# Patient Record
Sex: Female | Born: 1990 | Race: Black or African American | Hispanic: No | Marital: Single | Smoking: Current every day smoker
Health system: Southern US, Community
[De-identification: ages and names within clinical notes are randomized; demographics above are authoritative.]

## PROBLEM LIST (undated history)

## (undated) HISTORY — PX: APPENDECTOMY: SHX54

---
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
## 2011-01-18 LAB — DIFFERENTIAL
Basophils Absolute: 0 10*3/uL (ref 0.0–0.1)
Lymphocytes Relative: 11 % — ABNORMAL LOW (ref 12–46)
Lymphs Abs: 1.1 10*3/uL (ref 0.7–4.0)
Neutro Abs: 8.8 10*3/uL — ABNORMAL HIGH (ref 1.7–7.7)
Neutrophils Relative %: 82 % — ABNORMAL HIGH (ref 43–77)

## 2011-01-18 LAB — CBC
Platelets: 285 10*3/uL (ref 150–400)
RBC: 4.03 MIL/uL (ref 3.87–5.11)
RDW: 16.4 % — ABNORMAL HIGH (ref 11.5–15.5)
WBC: 10.6 10*3/uL — ABNORMAL HIGH (ref 4.0–10.5)

## 2011-01-18 LAB — URINALYSIS, ROUTINE W REFLEX MICROSCOPIC
Glucose, UA: NEGATIVE mg/dL
Hgb urine dipstick: NEGATIVE
Protein, ur: NEGATIVE mg/dL
Specific Gravity, Urine: 1.018 (ref 1.005–1.030)
pH: 7.5 (ref 5.0–8.0)

## 2011-01-18 LAB — URINE MICROSCOPIC-ADD ON

## 2011-01-18 LAB — PROTIME-INR
INR: 0.94 (ref 0.00–1.49)
Prothrombin Time: 12.8 seconds (ref 11.6–15.2)

## 2011-01-18 LAB — POCT I-STAT, CHEM 8
BUN: 9 mg/dL (ref 6–23)
Creatinine, Ser: 0.7 mg/dL (ref 0.4–1.2)
Hemoglobin: 12.2 g/dL (ref 12.0–15.0)
Potassium: 3.7 mEq/L (ref 3.5–5.1)
Sodium: 140 mEq/L (ref 135–145)

## 2011-01-28 ENCOUNTER — Inpatient Hospital Stay (HOSPITAL_COMMUNITY)
Admission: AD | Admit: 2011-01-28 | Discharge: 2011-01-28 | Disposition: A | Discharge status: Home / Self Care | Payer: PPO | Source: Ambulatory Visit | Attending: Obstetrics & Gynecology | Admitting: Obstetrics & Gynecology

## 2011-01-28 DIAGNOSIS — T148XXA Other injury of unspecified body region, initial encounter: Secondary | ICD-10-CM

## 2011-01-28 LAB — URINALYSIS, ROUTINE W REFLEX MICROSCOPIC
Bilirubin Urine: NEGATIVE
Hgb urine dipstick: NEGATIVE
Nitrite: NEGATIVE
Protein, ur: NEGATIVE mg/dL
Specific Gravity, Urine: 1.02 (ref 1.005–1.030)
Urobilinogen, UA: 1 mg/dL (ref 0.0–1.0)

## 2011-01-28 LAB — POCT PREGNANCY, URINE: Preg Test, Ur: NEGATIVE

## 2011-01-28 LAB — URINE MICROSCOPIC-ADD ON

## 2011-07-02 ENCOUNTER — Emergency Department (HOSPITAL_COMMUNITY)
Admission: EM | Admit: 2011-07-02 | Discharge: 2011-07-02 | Disposition: A | Discharge status: Home / Self Care | Payer: Self-pay | Attending: Emergency Medicine | Admitting: Emergency Medicine

## 2011-07-02 DIAGNOSIS — R112 Nausea with vomiting, unspecified: Secondary | ICD-10-CM | POA: Insufficient documentation

## 2011-07-02 DIAGNOSIS — K2970 Gastritis, unspecified, without bleeding: Secondary | ICD-10-CM | POA: Insufficient documentation

## 2011-07-02 LAB — POCT I-STAT, CHEM 8
Calcium, Ion: 1.2 mmol/L (ref 1.12–1.32)
Chloride: 104 mEq/L (ref 96–112)
Creatinine, Ser: 0.6 mg/dL (ref 0.50–1.10)
Glucose, Bld: 89 mg/dL (ref 70–99)
HCT: 40 % (ref 36.0–46.0)
Potassium: 3.8 mEq/L (ref 3.5–5.1)

## 2011-07-02 LAB — URINALYSIS, ROUTINE W REFLEX MICROSCOPIC
Glucose, UA: NEGATIVE mg/dL
Hgb urine dipstick: NEGATIVE
Specific Gravity, Urine: 1.029 (ref 1.005–1.030)
pH: 5.5 (ref 5.0–8.0)

## 2011-10-21 ENCOUNTER — Emergency Department (HOSPITAL_COMMUNITY)
Admission: EM | Admit: 2011-10-21 | Discharge: 2011-10-21 | Disposition: A | Discharge status: Home / Self Care | Payer: Self-pay | Attending: Emergency Medicine | Admitting: Emergency Medicine

## 2011-10-21 ENCOUNTER — Encounter: Payer: Self-pay | Admitting: Emergency Medicine

## 2011-10-21 DIAGNOSIS — R109 Unspecified abdominal pain: Secondary | ICD-10-CM | POA: Insufficient documentation

## 2011-10-21 DIAGNOSIS — R112 Nausea with vomiting, unspecified: Secondary | ICD-10-CM | POA: Insufficient documentation

## 2011-10-21 DIAGNOSIS — N898 Other specified noninflammatory disorders of vagina: Secondary | ICD-10-CM | POA: Insufficient documentation

## 2011-10-21 LAB — URINALYSIS, ROUTINE W REFLEX MICROSCOPIC
Bilirubin Urine: NEGATIVE
Hgb urine dipstick: NEGATIVE
Nitrite: NEGATIVE
Protein, ur: NEGATIVE mg/dL
Urobilinogen, UA: 1 mg/dL (ref 0.0–1.0)

## 2011-10-21 LAB — CBC
HCT: 35.5 % — ABNORMAL LOW (ref 36.0–46.0)
Hemoglobin: 11.7 g/dL — ABNORMAL LOW (ref 12.0–15.0)
MCH: 28 pg (ref 26.0–34.0)
MCV: 84.9 fL (ref 78.0–100.0)
Platelets: 236 10*3/uL (ref 150–400)
RBC: 4.18 MIL/uL (ref 3.87–5.11)
WBC: 4 10*3/uL (ref 4.0–10.5)

## 2011-10-21 LAB — COMPREHENSIVE METABOLIC PANEL
ALT: 6 U/L (ref 0–35)
Alkaline Phosphatase: 59 U/L (ref 39–117)
BUN: 9 mg/dL (ref 6–23)
CO2: 27 mEq/L (ref 19–32)
Calcium: 9.7 mg/dL (ref 8.4–10.5)
GFR calc Af Amer: >90 mL/min (ref >90)
GFR calc non Af Amer: >90 mL/min (ref >90)
Glucose, Bld: 82 mg/dL (ref 70–99)
Sodium: 138 mEq/L (ref 135–145)

## 2011-10-21 LAB — DIFFERENTIAL
Eosinophils Absolute: 0.1 10*3/uL (ref 0.0–0.7)
Eosinophils Relative: 3 % (ref 0–5)
Lymphocytes Relative: 43 % (ref 12–46)
Lymphs Abs: 1.7 10*3/uL (ref 0.7–4.0)
Monocytes Absolute: 0.3 10*3/uL (ref 0.1–1.0)
Monocytes Relative: 8 % (ref 3–12)

## 2011-10-21 LAB — LIPASE, BLOOD: Lipase: 36 U/L (ref 11–59)

## 2011-10-21 MED ORDER — HYDROCODONE-ACETAMINOPHEN 5-325 MG PO TABS: ORAL_TABLET | ORAL | Status: AC

## 2011-10-21 MED ORDER — ONDANSETRON 8 MG PO TBDP: 8.0000 mg | ORAL_TABLET | Freq: Three times a day (TID) | ORAL | Status: AC | PRN

## 2011-10-21 NOTE — ED Provider Notes (Signed)
Medical screening examination/treatment/procedure(s) were performed by non-physician practitioner and as supervising physician I was immediately available for consultation/collaboration.   Koltan Portocarrero, MD 10/21/11 1108 

## 2011-10-21 NOTE — ED Provider Notes (Signed)
History     CSN: [id number] Arrival date & time: 10/21/2011  7:14 AM   First MD Initiated Contact with Patient 10/21/11 [phone]      Chief Complaint  Patient presents with  . Abdominal Pain    (Consider location/radiation/quality/duration/timing/severity/associated sxs/prior treatment) HPI Comments: Patient with onset of bilateral midabdominal pain described as a tightness, starting at about 6 PM yesterday afternoon. Pain was bad enough the patient called in from work. Approximately midnight she started having vomiting and noted some streaks of blood in the vomit. She went to sleep and woke up this morning with the pain still present. She came to the emergency department for evaluation. Patient denies fever, chest pain, shortness of breath, constipation or diarrhea. Patient denies blood in her urine or dysuria. Patient is currently on her menstrual period. She has implanon for birth control and states she's been having periods every 2 weeks that have been heavy.the patient has had an appendectomy one year ago and states that she had pain like this when she had an appendicitis.  Patient is a 20 y.o. female presenting with abdominal pain. The history is provided by the patient.  Abdominal Pain The primary symptoms of the illness include abdominal pain, nausea, vomiting and vaginal bleeding. The primary symptoms of the illness do not include fever, shortness of breath, diarrhea or dysuria. The current episode started 13 to 24 hours ago. The onset of the illness was gradual. The problem has been gradually worsening.  The patient has not had a change in bowel habit. Symptoms associated with the illness do not include chills, constipation, hematuria, frequency or back pain.    History reviewed. No pertinent past medical history.  History reviewed. No pertinent past surgical history.  No family history on file.  History  Substance Use Topics  . Smoking status: Never Smoker   . Smokeless  tobacco: Not on file  . Alcohol Use: No    OB History    Grav Para Term Preterm Abortions TAB SAB Ect Mult Living                  Review of Systems  Constitutional: Negative for fever and chills.  HENT: Negative for sore throat and rhinorrhea.   Eyes: Negative for discharge.  Respiratory: Negative for shortness of breath.   Cardiovascular: Negative for chest pain.  Gastrointestinal: Positive for nausea, vomiting and abdominal pain. Negative for diarrhea and constipation.  Genitourinary: Positive for vaginal bleeding. Negative for dysuria, frequency and hematuria.  Musculoskeletal: Negative for myalgias and back pain.  Skin: Negative for rash.  Neurological: Negative for headaches.  Psychiatric/Behavioral: Negative for confusion.    Allergies  Review of patient's allergies indicates no known allergies.  Home Medications  No current outpatient prescriptions on file.  BP 136/89  Pulse 73  Temp(Src) 98.2 F (36.8 C) (Oral)  Resp 20  SpO2 100%  LMP 10/20/2011  Physical Exam  Nursing note and vitals reviewed. Constitutional: She is oriented to person, place, and time. She appears well-developed and well-nourished.  HENT:  Head: Normocephalic and atraumatic.  Eyes: Right eye exhibits no discharge. Left eye exhibits no discharge.  Neck: Normal range of motion. Neck supple.  Cardiovascular: Normal rate and regular rhythm.  Exam reveals no gallop and no friction rub.   No murmur heard. Pulmonary/Chest: Effort normal and breath sounds normal. No respiratory distress. She has no wheezes.  Abdominal: Soft. Bowel sounds are normal. She exhibits no distension and no mass. There is tenderness. There is  no rebound and no guarding.       Mild tenderness exhibited with deep palpation of bilateral mid abdomen. No rebound or guarding. Bowel sounds normal.  Musculoskeletal: Normal range of motion.  Neurological: She is alert and oriented to person, place, and time.  Skin: Skin is warm  and dry. No rash noted.  Psychiatric: She has a normal mood and affect.    ED Course  Procedures (including critical care time)  Labs Reviewed  CBC - Abnormal; Notable for the following:    Hemoglobin 11.7 (*)    HCT 35.5 (*)    All other components within normal limits  COMPREHENSIVE METABOLIC PANEL - Abnormal; Notable for the following:    Total Bilirubin 0.2 (*)    All other components within normal limits  URINALYSIS, ROUTINE W REFLEX MICROSCOPIC - Abnormal; Notable for the following:    Leukocytes, UA SMALL (*)    All other components within normal limits  URINE MICROSCOPIC-ADD ON - Abnormal; Notable for the following:    Squamous Epithelial / LPF MANY (*)    All other components within normal limits  DIFFERENTIAL  LIPASE, BLOOD  POCT PREGNANCY, URINE  POCT PREGNANCY, URINE   No results found.   1. Abdominal pain     7:44 AM patient seen and examined. Workup initiated.  10:27 AM Patient was discussed with Gwyneth Sprout, MD  10:27 AM patient informed of lab results. Exam is unchanged. She is asking for something to eat. Patient was given ginger ale and crackers in the room and she ate those without any difficulty. Patient given strict return instructions including having worsening pain, fever, persistent vomiting. Patient is up walking around in emergency department. Patient urged to return in 24 hours for recheck if no improvement in symptoms.  MDM  Vitals are stable, no fever.  No signs of dehydration, tolerating PO's.  Lungs are clear.  No focal abdominal pain, no concern for appendicitis, cholecystitis, pancreatitis, ruptured viscus, UTI, kidney stone, or any other abdominal etiology.  Supportive therapy indicated with return if symptoms worsen.  Patient counseled.         Eustace Moore [place], [place] 10/21/11 [phone]

## 2011-10-21 NOTE — ED Notes (Signed)
PT. REPORTS UPPER/MID ABDOMINAL PAIN WITH VOMITTING ONSET LAST NIGHT , NO DIARRHEA , DENIES FEVER OR CHILLS.

## 2012-06-15 ENCOUNTER — Emergency Department (HOSPITAL_COMMUNITY)
Admission: EM | Admit: 2012-06-15 | Discharge: 2012-06-15 | Disposition: A | Discharge status: Home / Self Care | Payer: Self-pay | Attending: Emergency Medicine | Admitting: Emergency Medicine

## 2012-06-15 ENCOUNTER — Encounter (HOSPITAL_COMMUNITY): Payer: Self-pay | Admitting: *Deleted

## 2012-06-15 DIAGNOSIS — S39012A Strain of muscle, fascia and tendon of lower back, initial encounter: Secondary | ICD-10-CM

## 2012-06-15 DIAGNOSIS — M5380 Other specified dorsopathies, site unspecified: Secondary | ICD-10-CM | POA: Insufficient documentation

## 2012-06-15 DIAGNOSIS — M546 Pain in thoracic spine: Secondary | ICD-10-CM | POA: Insufficient documentation

## 2012-06-15 LAB — URINALYSIS, ROUTINE W REFLEX MICROSCOPIC
Glucose, UA: NEGATIVE mg/dL
Leukocytes, UA: NEGATIVE
Nitrite: NEGATIVE
pH: 6 (ref 5.0–8.0)

## 2012-06-15 LAB — URINE MICROSCOPIC-ADD ON

## 2012-06-15 LAB — PREGNANCY, URINE: Preg Test, Ur: NEGATIVE

## 2012-06-15 MED ORDER — IBUPROFEN 800 MG PO TABS: 800.0000 mg | ORAL_TABLET | Freq: Three times a day (TID) | ORAL | Status: AC | PRN

## 2012-06-15 MED ORDER — DIAZEPAM 5 MG PO TABS: 5.0000 mg | ORAL_TABLET | Freq: Three times a day (TID) | ORAL | Status: AC | PRN

## 2012-06-15 MED ORDER — HYDROCODONE-ACETAMINOPHEN 5-325 MG PO TABS
1.0000 | ORAL_TABLET | Freq: Once | ORAL | Status: AC
  Administered 2012-06-15: 1 via ORAL
  Filled 2012-06-15: qty 1

## 2012-06-15 MED ORDER — IBUPROFEN 800 MG PO TABS
800.0000 mg | ORAL_TABLET | Freq: Once | ORAL | Status: AC
  Administered 2012-06-15: 800 mg via ORAL
  Filled 2012-06-15: qty 1

## 2012-06-15 NOTE — ED Notes (Signed)
Lower back pain for one week.  No known injury.  lmp now

## 2012-06-15 NOTE — ED Notes (Signed)
The patient is AOx4 and comfortable with her discharge instructions.  The patient's ride home is present. 

## 2012-06-15 NOTE — ED Provider Notes (Signed)
History     CSN: [id number]  Arrival date & time 06/15/12  0053   First MD Initiated Contact with Patient 06/15/12 0059      Chief Complaint  Patient presents with  . Back Pain    (Consider location/radiation/quality/duration/timing/severity/associated sxs/prior treatment) HPI Comments: Patient with no significant past medical history presents to the emergency department with chief complaint of back pain.  Onset of symptoms began approximately a week ago when patient was moving.  She states the pain is located in her mid thoracic region but denies any trauma.  She has not tried any home therapies including over-the-counter medications or heat/cold therapy.  She denies numbness or tingling of extremities, saddle paresthesias, inability to ambulate, loss control bar bladder, IV drug use, cancer history, dysuria, urinary frequency, hematuria, vaginal dc, N/V, fever, night sweats or chills.pt states she is currently on her menstrual period and denies a hx of back pain   Patient is a 21 y.o. female presenting with back pain. The history is provided by the patient.  Back Pain     History reviewed. No pertinent past medical history.  History reviewed. No pertinent past surgical history.  No family history on file.  History  Substance Use Topics  . Smoking status: Never Smoker   . Smokeless tobacco: Not on file  . Alcohol Use: No    OB History    Grav Para Term Preterm Abortions TAB SAB Ect Mult Living                  Review of Systems  Musculoskeletal: Positive for back pain.  All other systems reviewed and are negative.    Allergies  Review of patient's allergies indicates no known allergies.  Home Medications   Current Outpatient Rx  Name Route Sig Dispense Refill  . ETONOGESTREL 68 MG IXL IMPL Subcutaneous Inject 1 each into the skin once. Due to be removed this year      BP 110/70  Pulse 80  Temp 98.3 F (36.8 C) (Oral)  Resp 20  LMP 06/15/2012  Physical  Exam  Nursing note and vitals reviewed. Constitutional: She is oriented to person, place, and time. She appears well-developed and well-nourished. No distress.  HENT:  Head: Normocephalic and atraumatic.  Eyes: Conjunctivae and EOM are normal. Pupils are equal, round, and reactive to light. No scleral icterus.  Neck: Normal range of motion and full passive range of motion without pain. Neck supple. No spinous process tenderness and no muscular tenderness present. No rigidity. Normal range of motion present. No Brudzinski's sign noted.  Cardiovascular: Normal rate, regular rhythm and intact distal pulses.  Exam reveals no gallop and no friction rub.   No murmur heard. Pulmonary/Chest: Effort normal and breath sounds normal. No respiratory distress. She has no wheezes. She has no rales. She exhibits no tenderness.  Musculoskeletal:       Cervical back: She exhibits normal range of motion, no tenderness, no bony tenderness and no pain.       Thoracic back: She exhibits tenderness, bony tenderness, pain and spasm.       Lumbar back: She exhibits no tenderness, no bony tenderness, no pain, no spasm and normal pulse.       Back:       Right foot: She exhibits no swelling.       Left foot: She exhibits no swelling.       Bilateral lower extremities nontender without color change, baseline range of motion of extremities with  intact distal pulses, capillary refill less than 2 seconds bilaterally.  Pt has increased pain w ROM of lumbar spine. Pain w ambulation, no sign of ataxia.  Neurological: She is alert and oriented to person, place, and time. She has normal strength and normal reflexes. No sensory deficit. Gait (no ataxia, slowed and hunched d/t pain ) abnormal.       Sensation at baseline for light touch in all 4 distal extremities, motor symmetric & bilateral 5/5 (hips: abduction, adduction, flexion; knee: flexion & extension; foot: dorsiflexion, plantar flexion, toes: dorsi flexion) Patellar &  ankle reflexes intact.   Skin: Skin is warm and dry. No rash noted. She is not diaphoretic. No erythema. No pallor.  Psychiatric: She has a normal mood and affect.    ED Course  Procedures (including critical care time)  Labs Reviewed  URINALYSIS, ROUTINE W REFLEX MICROSCOPIC - Abnormal; Notable for the following:    APPearance CLOUDY (*)     Hgb urine dipstick LARGE (*)     Protein, ur 30 (*)     All other components within normal limits  URINE MICROSCOPIC-ADD ON - Abnormal; Notable for the following:    Squamous Epithelial / LPF FEW (*)     All other components within normal limits  PREGNANCY, URINE   No results found.   No diagnosis found.    MDM  Back pain, muscle spasm   Patient with back pain.  No neurological deficits and normal neuro exam.  Patient can walk but states is painful.  No loss of bowel or bladder control.  No concern for cauda equina.  No fever, night sweats, weight loss, h/o cancer, IVDU.  RICE protocol and pain medicine indicated and discussed with patient.        Jaci Carrel, [place] 06/15/12 [phone]

## 2012-06-15 NOTE — ED Provider Notes (Signed)
Medical screening examination/treatment/procedure(s) were performed by non-physician practitioner and as supervising physician I was immediately available for consultation/collaboration.  Theador Jezewski, MD 06/15/12 0704 

## 2013-02-19 ENCOUNTER — Emergency Department (HOSPITAL_COMMUNITY)
Admission: EM | Admit: 2013-02-19 | Discharge: 2013-02-19 | Disposition: A | Discharge status: Home / Self Care | Payer: Self-pay | Attending: Emergency Medicine | Admitting: Emergency Medicine

## 2013-02-19 ENCOUNTER — Encounter (HOSPITAL_COMMUNITY): Payer: Self-pay | Admitting: Emergency Medicine

## 2013-02-19 ENCOUNTER — Emergency Department (HOSPITAL_COMMUNITY): Payer: Self-pay

## 2013-02-19 DIAGNOSIS — M25469 Effusion, unspecified knee: Secondary | ICD-10-CM | POA: Insufficient documentation

## 2013-02-19 DIAGNOSIS — F17200 Nicotine dependence, unspecified, uncomplicated: Secondary | ICD-10-CM | POA: Insufficient documentation

## 2013-02-19 DIAGNOSIS — M25461 Effusion, right knee: Secondary | ICD-10-CM

## 2013-02-19 MED ORDER — NAPROXEN 500 MG PO TABS: 500.0000 mg | ORAL_TABLET | Freq: Two times a day (BID) | ORAL | Status: DC

## 2013-02-19 MED ORDER — HYDROCODONE-ACETAMINOPHEN 5-325 MG PO TABS: 1.0000 | ORAL_TABLET | Freq: Four times a day (QID) | ORAL | Status: DC | PRN

## 2013-02-19 NOTE — ED Notes (Signed)
Ortho paged. 

## 2013-02-19 NOTE — Progress Notes (Signed)
Orthopedic Tech Progress Note Patient Details:  Elaine Gonzalez 1990-11-29 [id number]  Ortho Devices Type of Ortho Device: Crutches;Knee Immobilizer Ortho Device/Splint Location: RLE Ortho Device/Splint Interventions: Ordered;Application   Jennye Moccasin 02/19/2013, 6:57 PM

## 2013-02-19 NOTE — ED Provider Notes (Signed)
History    This chart was scribed for non-physician practitioner working with Carleene Cooper III, MD by Sofie Rower, ED Scribe. This patient was seen in room TR05C/TR05C and the patient's care was started at 6:06PM.   CSN: [id number]  Arrival date & time 02/19/13  1624   First MD Initiated Contact with Patient 02/19/13 1806      Chief Complaint  Patient presents with  . Knee Pain    (Consider location/radiation/quality/duration/timing/severity/associated sxs/prior treatment) Patient is a 22 y.o. female presenting with knee pain. The history is provided by the patient. No language interpreter was used.  Knee Pain Location:  Knee Time since incident:  1 day Injury: no   Knee location:  R knee Pain details:    Quality:  Aching   Radiates to:  Does not radiate   Severity:  Moderate   Onset quality:  Sudden   Duration:  1 day   Timing:  Constant   Progression:  Worsening Chronicity:  New Dislocation: no   Foreign body present:  No foreign bodies Tetanus status:  Unknown Prior injury to area:  No Relieved by:  Nothing Worsened by:  Bearing weight and activity Ineffective treatments:  None tried   Elaine Gonzalez is a 22 y.o. female , with no known medical hx, who presents to the Emergency Department complaining of sudden, progressively worsening, non radiating, knee pain, located at the right knee, onset yesterday (02/18/13).  Associated symptoms include swelling located at the right knee. The pt reports she was chasing after her puppy dog yesterday, where she believes she may have injured her right knee. The pt informs she is unable to ambulate upon her right lower extremity at the present point and time.  The pt is a current everyday smoker, in addition to drinking alcohol.     History reviewed. No pertinent past medical history.  History reviewed. No pertinent past surgical history.  History reviewed. No pertinent family history.  History  Substance Use Topics  . Smoking  status: Current Every Day Smoker  . Smokeless tobacco: Not on file  . Alcohol Use: Yes    OB History   Grav Para Term Preterm Abortions TAB SAB Ect Mult Living                  Review of Systems  Musculoskeletal: Positive for joint swelling and arthralgias.  All other systems reviewed and are negative.    Allergies  Review of patient's allergies indicates no known allergies.  Home Medications   Current Outpatient Rx  Name  Route  Sig  Dispense  Refill  . etonogestrel (IMPLANON) 68 MG IMPL implant   Subcutaneous   Inject 1 each into the skin once. Due to be removed this year           BP 123/83  Pulse 100  Temp(Src) 97.9 F (36.6 C) (Oral)  Resp 18  SpO2 100%  LMP 02/19/2013  Physical Exam  Nursing note and vitals reviewed. Constitutional: She is oriented to person, place, and time. She appears well-developed and well-nourished. No distress.  HENT:  Head: Normocephalic and atraumatic.  Eyes: EOM are normal.  Neck: Neck supple. No tracheal deviation present.  Cardiovascular: Normal rate, regular rhythm and normal heart sounds.  Exam reveals no gallop and no friction rub.   No murmur heard. Pulmonary/Chest: Effort normal and breath sounds normal. No respiratory distress. She has no wheezes.  Abdominal: Bowel sounds are normal. There is no tenderness.  Musculoskeletal: Normal range of  motion.       Right knee: She exhibits swelling. Tenderness found.  Swelling and tenderness. No instability noted on exam. Distal pulses, sensation, and movement present.   Neurological: She is alert and oriented to person, place, and time.  Skin: Skin is warm and dry.  Psychiatric: She has a normal mood and affect. Her behavior is normal.    ED Course  Procedures (including critical care time)  DIAGNOSTIC STUDIES: Oxygen Saturation is 100% on room air, normal by my interpretation.    COORDINATION OF CARE:  6:32 PM- Treatment plan concerning follow up with orthopedist  discussed with patient. Pt agrees with treatment.      Labs Reviewed - No data to display Dg Knee Complete 4 Views Right  02/19/2013  *RADIOLOGY REPORT*  Clinical Data: Fall.  Anterior knee pain.  RIGHT KNEE - COMPLETE 4+ VIEW  Comparison: None.  Findings: The patient has a large knee joint effusion.  No fracture is identified.  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  IMPRESSION: Large knee joint effusion without underlying fracture.   Original Report Authenticated By: Holley Dexter, M.D.      No diagnosis found.  Knee effusion.  Immobilizer, crutches, anti-inflammatory.  Ortho follow-up.  MDM    I personally performed the services described in this documentation, which was scribed in my presence. The recorded information has been reviewed and is accurate.        Jimmye Norman, NP 02/20/13 0000

## 2013-02-19 NOTE — ED Notes (Signed)
Pt c/o right knee pain after running yesterday; pt sts pain and swelling

## 2013-02-20 NOTE — ED Provider Notes (Signed)
Medical screening examination/treatment/procedure(s) were performed by non-physician practitioner and as supervising physician I was immediately available for consultation/collaboration.   Carleene Cooper III, MD 02/20/13 [phone]

## 2013-10-01 ENCOUNTER — Emergency Department (HOSPITAL_COMMUNITY)
Admission: EM | Admit: 2013-10-01 | Discharge: 2013-10-01 | Disposition: A | Discharge status: Home / Self Care | Payer: Medicaid - Out of State | Attending: Emergency Medicine | Admitting: Emergency Medicine

## 2013-10-01 ENCOUNTER — Encounter (HOSPITAL_COMMUNITY): Payer: Self-pay | Admitting: Emergency Medicine

## 2013-10-01 DIAGNOSIS — K529 Noninfective gastroenteritis and colitis, unspecified: Secondary | ICD-10-CM

## 2013-10-01 DIAGNOSIS — Z3202 Encounter for pregnancy test, result negative: Secondary | ICD-10-CM | POA: Insufficient documentation

## 2013-10-01 DIAGNOSIS — F17200 Nicotine dependence, unspecified, uncomplicated: Secondary | ICD-10-CM | POA: Insufficient documentation

## 2013-10-01 DIAGNOSIS — R109 Unspecified abdominal pain: Secondary | ICD-10-CM

## 2013-10-01 DIAGNOSIS — K5289 Other specified noninfective gastroenteritis and colitis: Secondary | ICD-10-CM | POA: Insufficient documentation

## 2013-10-01 DIAGNOSIS — R509 Fever, unspecified: Secondary | ICD-10-CM | POA: Insufficient documentation

## 2013-10-01 LAB — URINALYSIS, ROUTINE W REFLEX MICROSCOPIC
Bilirubin Urine: NEGATIVE
Nitrite: NEGATIVE
Specific Gravity, Urine: 1.022 (ref 1.005–1.030)
Urobilinogen, UA: 0.2 mg/dL (ref 0.0–1.0)
pH: 6 (ref 5.0–8.0)

## 2013-10-01 LAB — COMPREHENSIVE METABOLIC PANEL
Albumin: 4.6 g/dL (ref 3.5–5.2)
Alkaline Phosphatase: 50 U/L (ref 39–117)
BUN: 9 mg/dL (ref 6–23)
Calcium: 9.5 mg/dL (ref 8.4–10.5)
GFR calc Af Amer: >90 mL/min (ref >90)
Glucose, Bld: 102 mg/dL — ABNORMAL HIGH (ref 70–99)
Potassium: 4 mEq/L (ref 3.5–5.1)
Sodium: 140 mEq/L (ref 135–145)
Total Protein: 8 g/dL (ref 6.0–8.3)

## 2013-10-01 LAB — URINE MICROSCOPIC-ADD ON

## 2013-10-01 LAB — POCT PREGNANCY, URINE: Preg Test, Ur: NEGATIVE

## 2013-10-01 LAB — CBC
HCT: 36.3 % (ref 36.0–46.0)
Hemoglobin: 12.4 g/dL (ref 12.0–15.0)
MCH: 29.1 pg (ref 26.0–34.0)
MCHC: 34.2 g/dL (ref 30.0–36.0)
RDW: 14.7 % (ref 11.5–15.5)

## 2013-10-01 LAB — LIPASE, BLOOD: Lipase: 34 U/L (ref 11–59)

## 2013-10-01 MED ORDER — KETOROLAC TROMETHAMINE 30 MG/ML IJ SOLN
30.0000 mg | Freq: Once | INTRAMUSCULAR | Status: AC
  Administered 2013-10-01: 30 mg via INTRAVENOUS
  Filled 2013-10-01: qty 1

## 2013-10-01 MED ORDER — SODIUM CHLORIDE 0.9 % IV BOLUS (SEPSIS)
1000.0000 mL | Freq: Once | INTRAVENOUS | Status: AC
  Administered 2013-10-01: 1000 mL via INTRAVENOUS

## 2013-10-01 MED ORDER — HYDROMORPHONE HCL PF 1 MG/ML IJ SOLN
1.0000 mg | Freq: Once | INTRAMUSCULAR | Status: AC
  Administered 2013-10-01: 1 mg via INTRAVENOUS
  Filled 2013-10-01: qty 1

## 2013-10-01 MED ORDER — ONDANSETRON HCL 4 MG/2ML IJ SOLN
4.0000 mg | Freq: Once | INTRAMUSCULAR | Status: AC
  Administered 2013-10-01: 4 mg via INTRAVENOUS
  Filled 2013-10-01: qty 2

## 2013-10-01 NOTE — ED Notes (Signed)
Pt came to the ED stating that she has been having lower abdominal pain this morning. Pt stated that she has been having nausea, vomiting, and diarrhea. She stated that she is currently on her menstrual cycle. She has never had abdominal pain this bad. No cardiac or respiratory distress. Will continue to monitor.

## 2013-10-01 NOTE — ED Notes (Addendum)
Lower abd pain  Started this am some nausea is on her period now she states  Denies dysuria or d/c

## 2013-10-01 NOTE — ED Provider Notes (Signed)
CSN: [id number]     Arrival date & time 10/01/13  1014 History   First MD Initiated Contact with Patient 10/01/13 1016     Chief Complaint  Patient presents with  . Abdominal Pain   (Consider location/radiation/quality/duration/timing/severity/associated sxs/prior Treatment) HPI Comments: Pt is a 22 y/o healthy female who presents to the ED complaining of sudden onset abdominal pain beginning when she woke up a few hours ago. Pain described as sharp, radiating across her lower abdomen, constant rated 10/10. Standing upright worsens the pain, somewhat alleviated by bending forward. Admits to associated nausea, non-blood, non-bilious vomiting, non-bloody diarrhea, subjective fever and chills. No sick contacts, recent travel. Had onion rings for dinner last night. Began her menses yesterday, normal for her. States she gets back pain with her menses, has no back pain out of her normal.denies increased urinary frequency, urgency, dysuria, vaginal or pelvic pain. Hx of appendectomy 2011.  The history is provided by the patient.    History reviewed. No pertinent past medical history. History reviewed. No pertinent past surgical history. No family history on file. History  Substance Use Topics  . Smoking status: Current Every Day Smoker  . Smokeless tobacco: Not on file  . Alcohol Use: Yes   OB History   Grav Para Term Preterm Abortions TAB SAB Ect Mult Living                 Review of Systems  Constitutional: Positive for fever and chills.  Gastrointestinal: Positive for nausea, vomiting, abdominal pain and diarrhea.  All other systems reviewed and are negative.    Allergies  Review of patient's allergies indicates no known allergies.  Home Medications   Current Outpatient Rx  Name  Route  Sig  Dispense  Refill  . Acetaminophen-Caff-Pyrilamine (MIDOL COMPLETE PO)   Oral   Take 2 tablets by mouth daily as needed (mentral pain).         Marland Kitchen etonogestrel (IMPLANON) 68 MG IMPL  implant   Subcutaneous   Inject 1 each into the skin once. Due to be removed this year          BP 129/75  Pulse 66  Temp(Src) 98.2 F (36.8 C) (Oral)  Resp 20  SpO2 98% Physical Exam  Nursing note and vitals reviewed. Constitutional: She is oriented to person, place, and time. She appears well-developed and well-nourished. No distress.  Appears uncomfortable.  HENT:  Head: Normocephalic and atraumatic.  Mouth/Throat: Oropharynx is clear and moist.  Eyes: Conjunctivae are normal. No scleral icterus.  Neck: Normal range of motion. Neck supple.  Cardiovascular: Normal rate, regular rhythm and normal heart sounds.   Pulmonary/Chest: Effort normal and breath sounds normal.  Abdominal: Soft. Normal appearance and bowel sounds are normal. There is tenderness. There is guarding. There is no rigidity and no rebound.  Tenderness across lower abdomen. No peritoneal signs.  Musculoskeletal: Normal range of motion. She exhibits no edema.  Neurological: She is alert and oriented to person, place, and time.  Skin: Skin is warm and dry. She is not diaphoretic.  Psychiatric: She has a normal mood and affect. Her behavior is normal.    ED Course  Procedures (including critical care time) Labs Review Labs Reviewed  COMPREHENSIVE METABOLIC PANEL - Abnormal; Notable for the following:    Glucose, Bld 102 (*)    Total Bilirubin 0.2 (*)    All other components within normal limits  URINALYSIS, ROUTINE W REFLEX MICROSCOPIC - Abnormal; Notable for the following:  APPearance CLOUDY (*)    Hgb urine dipstick LARGE (*)    Leukocytes, UA MODERATE (*)    All other components within normal limits  URINE MICROSCOPIC-ADD ON - Abnormal; Notable for the following:    Squamous Epithelial / LPF MANY (*)    All other components within normal limits  CBC  LIPASE, BLOOD  POCT PREGNANCY, URINE   Imaging Review No results found.  EKG Interpretation   None       MDM   1. Abdominal pain   2.  Gastroenteritis     Pt with lower abdominal pain, n/v/d. She is in NAD but appears uncomfortable. Normal VS. No peritoneal signs.  Currently on menses. Labs pending- cbc, cmp, lipase, ua, preg. Fluids, pain control. 12:15 PM Labs normal. Pain improved, repeat abdominal exam pt is mildly tender in lower abdomen. Tolerating PO, states eating crackers helped w the pain. Stable for discharge. Return precautions given. Patient states understanding of treatment care plan and is agreeable.   Trevor Mace, PA-C 10/01/13 1216

## 2013-10-01 NOTE — ED Notes (Signed)
Pt made aware that she cannot drive on medication given at the hospital. Pt and significant other verbalized they understood.

## 2013-10-01 NOTE — ED Notes (Signed)
Provided pt with Ginger Ale to drink per her request.  Also, gave her crackers to eat. Educated patient to take small sips of ginger ale. If she doesn't become sick she can then try the crackers. Will continue to monitor.

## 2013-10-01 NOTE — ED Notes (Signed)
Pt drank all ginger ale and ate 2 packs of crackers.  No nausea or vomiting. PA is aware.

## 2013-10-06 NOTE — ED Provider Notes (Signed)
Medical screening examination/treatment/procedure(s) were performed by non-physician practitioner and as supervising physician I was immediately available for consultation/collaboration.  EKG Interpretation   None         Roney Marion, MD 10/06/13 1538

## 2013-11-21 ENCOUNTER — Encounter (HOSPITAL_COMMUNITY): Payer: Self-pay | Admitting: Emergency Medicine

## 2013-11-21 ENCOUNTER — Emergency Department (HOSPITAL_COMMUNITY): Payer: Self-pay

## 2013-11-21 ENCOUNTER — Emergency Department (HOSPITAL_COMMUNITY)
Admission: EM | Admit: 2013-11-21 | Discharge: 2013-11-21 | Disposition: A | Discharge status: Home / Self Care | Payer: Self-pay | Attending: Emergency Medicine | Admitting: Emergency Medicine

## 2013-11-21 DIAGNOSIS — F17200 Nicotine dependence, unspecified, uncomplicated: Secondary | ICD-10-CM | POA: Insufficient documentation

## 2013-11-21 DIAGNOSIS — R109 Unspecified abdominal pain: Secondary | ICD-10-CM | POA: Insufficient documentation

## 2013-11-21 DIAGNOSIS — Z3202 Encounter for pregnancy test, result negative: Secondary | ICD-10-CM | POA: Insufficient documentation

## 2013-11-21 DIAGNOSIS — R6883 Chills (without fever): Secondary | ICD-10-CM | POA: Insufficient documentation

## 2013-11-21 DIAGNOSIS — R112 Nausea with vomiting, unspecified: Secondary | ICD-10-CM | POA: Insufficient documentation

## 2013-11-21 DIAGNOSIS — Z9089 Acquired absence of other organs: Secondary | ICD-10-CM | POA: Insufficient documentation

## 2013-11-21 DIAGNOSIS — R110 Nausea: Secondary | ICD-10-CM

## 2013-11-21 LAB — URINE MICROSCOPIC-ADD ON

## 2013-11-21 LAB — WET PREP, GENITAL
Clue Cells Wet Prep HPF POC: NONE SEEN
Trich, Wet Prep: NONE SEEN
WBC, Wet Prep HPF POC: NONE SEEN
Yeast Wet Prep HPF POC: NONE SEEN

## 2013-11-21 LAB — URINALYSIS, ROUTINE W REFLEX MICROSCOPIC
Bilirubin Urine: NEGATIVE
Glucose, UA: NEGATIVE mg/dL
Ketones, ur: NEGATIVE mg/dL
NITRITE: NEGATIVE
PH: 6 (ref 5.0–8.0)
Protein, ur: NEGATIVE mg/dL
SPECIFIC GRAVITY, URINE: 1.023 (ref 1.005–1.030)
Urobilinogen, UA: 0.2 mg/dL (ref 0.0–1.0)

## 2013-11-21 LAB — COMPREHENSIVE METABOLIC PANEL
ALK PHOS: 49 U/L (ref 39–117)
ALT: 7 U/L (ref 0–35)
AST: 16 U/L (ref 0–37)
Albumin: 4.6 g/dL (ref 3.5–5.2)
BILIRUBIN TOTAL: 0.3 mg/dL (ref 0.3–1.2)
BUN: 8 mg/dL (ref 6–23)
CO2: 25 meq/L (ref 19–32)
CREATININE: 0.69 mg/dL (ref 0.50–1.10)
Calcium: 9.2 mg/dL (ref 8.4–10.5)
Chloride: 102 mEq/L (ref 96–112)
GFR calc Af Amer: >90 mL/min (ref >90)
Glucose, Bld: 96 mg/dL (ref 70–99)
Potassium: 4.1 mEq/L (ref 3.7–5.3)
SODIUM: 140 meq/L (ref 137–147)
Total Protein: 7.8 g/dL (ref 6.0–8.3)

## 2013-11-21 LAB — CBC WITH DIFFERENTIAL/PLATELET
Basophils Absolute: 0 10*3/uL (ref 0.0–0.1)
Basophils Relative: 1 % (ref 0–1)
Eosinophils Absolute: 0.1 10*3/uL (ref 0.0–0.7)
Eosinophils Relative: 3 % (ref 0–5)
HEMATOCRIT: 34.7 % — AB (ref 36.0–46.0)
Hemoglobin: 11.6 g/dL — ABNORMAL LOW (ref 12.0–15.0)
Lymphocytes Relative: 42 % (ref 12–46)
Lymphs Abs: 1.6 10*3/uL (ref 0.7–4.0)
MCH: 28.4 pg (ref 26.0–34.0)
MCHC: 33.4 g/dL (ref 30.0–36.0)
MCV: 84.8 fL (ref 78.0–100.0)
MONO ABS: 0.3 10*3/uL (ref 0.1–1.0)
Monocytes Relative: 7 % (ref 3–12)
Neutro Abs: 1.9 10*3/uL (ref 1.7–7.7)
Neutrophils Relative %: 48 % (ref 43–77)
Platelets: 228 10*3/uL (ref 150–400)
RBC: 4.09 MIL/uL (ref 3.87–5.11)
RDW: 14.3 % (ref 11.5–15.5)
WBC: 3.9 10*3/uL — ABNORMAL LOW (ref 4.0–10.5)

## 2013-11-21 LAB — LIPASE, BLOOD: Lipase: 37 U/L (ref 11–59)

## 2013-11-21 LAB — PREGNANCY, URINE: Preg Test, Ur: NEGATIVE

## 2013-11-21 MED ORDER — SODIUM CHLORIDE 0.9 % IV SOLN
INTRAVENOUS | Status: DC
Start: 2013-11-21 — End: 2013-11-21
  Administered 2013-11-21: 10:00:00 via INTRAVENOUS

## 2013-11-21 MED ORDER — ONDANSETRON HCL 4 MG PO TABS: 4.0000 mg | ORAL_TABLET | Freq: Four times a day (QID) | ORAL | Status: DC

## 2013-11-21 MED ORDER — ONDANSETRON HCL 4 MG/2ML IJ SOLN
4.0000 mg | Freq: Once | INTRAMUSCULAR | Status: AC
  Administered 2013-11-21: 4 mg via INTRAVENOUS
  Filled 2013-11-21: qty 2

## 2013-11-21 NOTE — Discharge Instructions (Signed)
Please call and set-up an appointment with  Gastroenterology, stomach doctors, to be reassessed regarding stomach pain Please rest and stay hydrated Please refrain from eating foods high in fat, grease, and spicy Please take medications as needed for nausea control Please avoid any physical or strenuous activity Please continue to monitor symptoms closely and if symptoms are to worsen or change (fever greater 101, chills, sweating, neck pain, neck stiffness, worsening or change to stomach pain, chest pain, shortness of breath, difficulty breathing) please report back to the ED immediately   Abdominal Pain, Women Abdominal (stomach, pelvic, or belly) pain can be caused by many things. It is important to tell your doctor:  The location of the pain.  Does it come and go or is it present all the time?  Are there things that start the pain (eating certain foods, exercise)?  Are there other symptoms associated with the pain (fever, nausea, vomiting, diarrhea)? All of this is helpful to know when trying to find the cause of the pain. CAUSES   Stomach: virus or bacteria infection, or ulcer.  Intestine: appendicitis (inflamed appendix), regional ileitis (Crohn's disease), ulcerative colitis (inflamed colon), irritable bowel syndrome, diverticulitis (inflamed diverticulum of the colon), or cancer of the stomach or intestine.  Gallbladder disease or stones in the gallbladder.  Kidney disease, kidney stones, or infection.  Pancreas infection or cancer.  Fibromyalgia (pain disorder).  Diseases of the female organs:  Uterus: fibroid (non-cancerous) tumors or infection.  Fallopian tubes: infection or tubal pregnancy.  Ovary: cysts or tumors.  Pelvic adhesions (scar tissue).  Endometriosis (uterus lining tissue growing in the pelvis and on the pelvic organs).  Pelvic congestion syndrome (female organs filling up with blood just before the menstrual period).  Pain with the  menstrual period.  Pain with ovulation (producing an egg).  Pain with an IUD (intrauterine device, birth control) in the uterus.  Cancer of the female organs.  Functional pain (pain not caused by a disease, may improve without treatment).  Psychological pain.  Depression. DIAGNOSIS  Your doctor will decide the seriousness of your pain by doing an examination.  Blood tests.  X-rays.  Ultrasound.  CT scan (computed tomography, special type of X-ray).  MRI (magnetic resonance imaging).  Cultures, for infection.  Barium enema (dye inserted in the large intestine, to better view it with X-rays).  Colonoscopy (looking in intestine with a lighted tube).  Laparoscopy (minor surgery, looking in abdomen with a lighted tube).  Major abdominal exploratory surgery (looking in abdomen with a large incision). TREATMENT  The treatment will depend on the cause of the pain.   Many cases can be observed and treated at home.  Over-the-counter medicines recommended by your caregiver.  Prescription medicine.  Antibiotics, for infection.  Birth control pills, for painful periods or for ovulation pain.  Hormone treatment, for endometriosis.  Nerve blocking injections.  Physical therapy.  Antidepressants.  Counseling with a psychologist or psychiatrist.  Minor or major surgery. HOME CARE INSTRUCTIONS   Do not take laxatives, unless directed by your caregiver.  Take over-the-counter pain medicine only if ordered by your caregiver. Do not take aspirin because it can cause an upset stomach or bleeding.  Try a clear liquid diet (broth or water) as ordered by your caregiver. Slowly move to a bland diet, as tolerated, if the pain is related to the stomach or intestine.  Have a thermometer and take your temperature several times a day, and record it.  Bed rest and sleep, if it  helps the pain.  Avoid sexual intercourse, if it causes pain.  Avoid stressful situations.  Keep  your follow-up appointments and tests, as your caregiver orders.  If the pain does not go away with medicine or surgery, you may try:  Acupuncture.  Relaxation exercises (yoga, meditation).  Group therapy.  Counseling. SEEK MEDICAL CARE IF:   You notice certain foods cause stomach pain.  Your home care treatment is not helping your pain.  You need stronger pain medicine.  You want your IUD removed.  You feel faint or lightheaded.  You develop nausea and vomiting.  You develop a rash.  You are having side effects or an allergy to your medicine. SEEK IMMEDIATE MEDICAL CARE IF:   Your pain does not go away or gets worse.  You have a fever.  Your pain is felt only in portions of the abdomen. The right side could possibly be appendicitis. The left lower portion of the abdomen could be colitis or diverticulitis.  You are passing blood in your stools (bright red or black tarry stools, with or without vomiting).  You have blood in your urine.  You develop chills, with or without a fever.  You pass out. MAKE SURE YOU:   Understand these instructions.  Will watch your condition.  Will get help right away if you are not doing well or get worse. Document Released: 08/19/2007 Document Revised: 01/14/2012 Document Reviewed: 09/08/2009 [facility] Patient Information 2014 [place], [place].   Emergency Department Resource Guide 1) Find a Doctor and Pay Out of Pocket Although you won't have to find out who is covered by your insurance plan, it is a good idea to ask around and get recommendations. You will then need to call the office and see if the doctor you have chosen will accept you as a new patient and what types of options they offer for patients who are self-pay. Some doctors offer discounts or will set up payment plans for their patients who do not have insurance, but you will need to ask so you aren't surprised when you get to your appointment.  2) Contact Your Local  Health Department Not all health departments have doctors that can see patients for sick visits, but many do, so it is worth a call to see if yours does. If you don't know where your local health department is, you can check in your phone book. The CDC also has a tool to help you locate your state's health department, and many state websites also have listings of all of their local health departments.  3) Find a Walk-in Clinic If your illness is not likely to be very severe or complicated, you may want to try a walk in clinic. These are popping up all over the country in pharmacies, drugstores, and shopping centers. They're usually staffed by nurse practitioners or physician assistants that have been trained to treat common illnesses and complaints. They're usually fairly quick and inexpensive. However, if you have serious medical issues or chronic medical problems, these are probably not your best option.  No Primary Care Doctor: - Call Health Connect at  [phone] - they can help you locate a primary care doctor that  accepts your insurance, provides certain services, etc. - Physician Referral Service- [phone]  Chronic Pain Problems: Organization         Address  Phone   Notes  [facility]  [phone] Patients need to be referred by their primary care doctor.   Medication  Assistance: Organization         Address  Phone   Notes  [facility] Medication Assistance Program [address] [place] [place], [place] [address] [phone] --Must be a resident of [facility] -- Must have NO insurance coverage whatsoever (no Medicaid/ Medicare, etc.) -- The pt. MUST have a primary care doctor that directs their care regularly and follows them in the community   [facility]  [phone]   [employer]  [phone]    Agencies that provide inexpensive medical care: Organization         Address  Phone   Notes  [facility]  [phone]    [facility]    [phone]   [facility] [address] [place], [place] [address] [phone]   [facility] [address] [place] [place] [phone]   Planned Parenthood    [phone]   [facility]    [phone]   [facility]  [address], [place] Phone:  [phone], Fax:  [phone] Hours of Operation:  9 am - 6 pm, M-F.  Also accepts Medicaid/Medicare and self-pay.  [facility]  [address], [place] Phone: [phone], Fax: [phone]. Hours of Operation:  8:30 am - 5:30 pm, M-F.  Also accepts Medicaid and self-pay.  [facility] [place] [address] [place] Phone: [phone]   [facility] [address] [place]asha Bence [place], [place] [phone], Ext. [phone] Mondays & Thursdays: 7-9 AM.  First 15 patients are seen on a first come, first serve basis.    Medicaid-accepting [facility] Providers:  Organization         Address  Phone   Notes  [facility] [address],  [phone] Also accepts self-pay patients.  [facility] [address] [place] [address] Laurell Josephs [place], [place]  [phone]   [facility] [address], [place] [phone]   [facility] [address], [place] [phone]   Renaye Rakers [address], [place]   [phone] Only accepts [place] Access [place] patients after they have their name applied to their card.   Self-Pay (no insurance) in [facility]:  Organization         Address  Phone   Notes  Sickle Cell Patients, [facility] Internal Medicine [address] [place], [place] [phone]   [facility] [address] [place], [place] [phone]   [facility] [place]  [address],  [place] [phone]   [facility]/Dr. Osei-Bonsu  [address] [place] or [address], [place] [phone] Phone number for both [place] and [place] locations is the same.  [facility] [address] [place] [phone]   [facility] [address], [place] or [address] [phone]   [facility] [address], [place] [phone], phone; [phone], fax Sees patients 1st and 3rd Saturday of every month.  Must not qualify for public or private insurance (i.e. Medicaid, Medicare, [place] Health Choice, Veterans' Benefits)  Household income should be no more than 200% of the poverty level The clinic  cannot treat you if you are pregnant or think you are pregnant  Sexually transmitted diseases are not treated at the clinic.    Dental Care: Organization         Address  Phone  Notes  [facility] [address], [place] [phone] Accepts children up to age 22 who are enrolled in [place] or [place] Health Choice; pregnant women with a Medicaid card; and children who have applied for Medicaid or [place] Health Choice, but were declined, whose parents can pay a reduced fee at time of service.  [facility] [place]  [address], [place] [phone] Accepts children up to age 22 who are enrolled in [place] or [place] Health Choice; pregnant women with a Medicaid card; and children who have applied for Medicaid or [place] Health Choice, but were declined, whose parents can pay a reduced fee at time of service.  [facility]  [address], [place] [phone] Patients are seen by appointment only. Walk-ins are not accepted. [facility] will see patients 22 years of age and older. Monday - Tuesday (8am-5pm) Most Wednesdays  (8:30-5pm) $30 per visit, cash only  [facility]  [address], [facility] [phone]) [phone] Patients are seen by appointment only. Walk-ins are not accepted. [facility] will see patients 22 years of age and older. One Wednesday Evening (Monthly: Volunteer Based).  $30 per visit, cash only  [employer] [facility] [facility]  [phone] for adults; Children under age 794, call Graduate Pediatric Dentistry at [phone]. Children aged 344-14, please call [phone] to request a pediatric application.  Dental services are provided in all areas of dental care including fillings, crowns and bridges, complete and partial dentures, implants, gum treatment, root canals, and extractions. Preventive care is also provided. Treatment is provided to both adults and children. Patients are selected via a lottery and there is often a waiting list.   [facility] [address], [place]  [phone] [email or web address]   [facility] [address], [place], [place] [phone], Ext. [phone] Second and Fourth Thursday of each month, opens at 6:30 AM; Clinic ends at 9 AM.  Patients are seen on a first-come first-served basis, and a limited number are seen during each clinic.   [facility]  [address], [place], [place] [phone]) [phone]   Eligibility Requirements You must have lived in [place], [place], or [place] counties for at least the last three months.   You cannot be eligible for state or federal sponsored [place]healthcare insurance, including CIGNAVeterans Administration, [place]icaid, or [employer]icare.   You generally cannot be eligible for healthcare insurance through your employer.    How to apply: Eligibility screenings are held every Tuesday and Wednesday afternoon from 1:00 pm until 4:00 pm. You do not need an appointment for the interview!  [facility] [address], [place], [place] [phone]   [facility]  [phone]   [facility]  [phone]   [facility]  [phone]    Behavioral Health Resources in the Community: Intensive Outpatient Programs Organization         Address  Phone  Notes  [facility] [address], [place], [place] [phone]   [facility] [address], [place], [place]  [phone]   [facility] [address] [place], [place]  [phone]   [facility] [address]  [place], [place] [phone] or [phone]   Substance Abuse Resources Organization         Address  Phone  Notes  [facility] Drug [facility]  [phone]   [facility]  [phone]   [facility] [place]  [phone]   Floydene Flock  [phone]   Residential & Outpatient Substance Abuse Program  [phone]   Psychological Services Organization         Address  Phone  Notes  [facility] Behavioral Health  [phone]   [facility] Services  [phone]   [facility] Mental Health [address] [place] [phone] or [phone]    Mobile Crisis Teams Organization         Address  Phone  Notes  [facility], Mobile Crisis Care Unit  [phone]   [facility]  [address] [place], [place] [phone]   Doristine Locks [address] [place] [phone]    Self-Help/Support Groups Organization         Address  Phone             Notes  [facility] [place] - variety of support groups  336- I7437963 Call for more information  Narcotics Anonymous (NA), [facility] [address], [employer] [place]  2 meetings at this location   Statistician         Address  Phone  Notes  [facility] [address],    [place]  [phone]   [facility]  [address], [place] [address], [place], [place]  [phone]   [facility] Treatment [facility] [address] [place], [place] [phone] Admissions: 8am-3pm M-F  [facility] [address],    [place], [place] [phone]   [facility] [address] [place], [place], [place] [phone]   [facility] [address],  [place], [place] [phone]   [facility] - Intensive Outpatient [address] Laurell Josephs [address], [place], [place] [phone]   [facility] ([facility] [address] [place]  [place], [place] [phone] or [phone]   [facility] ([facility]) [address] [place], [place] [phone] Accepts Medicaid  [facility] [address],  [place] [phone] Substance Abuse/Addiction Treatment   [facility] Organization         Address  Phone  Notes  [facility]  [phone]   Angie Fava, PhD [address] Ervin Knack [place], [place]   [phone] or [phone]   [facility]   [address] [place], [place] [phone]   [facility] [address], [place], [place] [phone] Insurance/Medicaid/sponsorship through [facility] and Families [address]                                    [place], [place] [phone] Therapy/tele-psych/case  [facility] [address], [place] [phone]    Dr. Lolly Mustache  [phone]   [facility] [place]  [employer] [facility] Dept. 1) [address] [place] 2) [address], [place] 3)  [address] [place] [address], [place] [phone]  (  [phone]   °[place] Child Abuse Hotline (336) 342-1394 or (336) 342-3537 (After Hours)    ° ° ° °

## 2013-11-21 NOTE — ED Notes (Signed)
PT comfortable with d/c and f/u instructions. Prescriptions x1 

## 2013-11-21 NOTE — ED Notes (Signed)
Patient presents with a chief complaint of bilateral lower quadrant abdominal pain with bilateral flank pain that began this AM. Patient reports one episode of vomiting this morning. Patient reports last bowel movement was "last week" and states this is normal for her.

## 2013-11-21 NOTE — ED Notes (Signed)
Dr. J at bedside 

## 2013-11-21 NOTE — ED Provider Notes (Signed)
CSN: [id number]     Arrival date & time 11/21/13  0932 History   First MD Initiated Contact with Patient 11/21/13 0935     Chief Complaint  Patient presents with  . Abdominal Pain   (Consider location/radiation/quality/duration/timing/severity/associated sxs/prior Treatment) The history is provided by the patient. No language interpreter was used.  Elaine Gonzalez is a 23 year old female with no significant past medical history-currently on birth control, Implanon-presenting to emergency department with abdominal pain that started approximately 2 days ago. Patient reports that the pain has gotten progressively worse. Stated that the pain is localized to bilateral lower quadrants described as a constant sharp shooting pain that radiates to her lower back. Patient reports she has been having nausea with one episode of emesis this morning mainly consisting of stomach contents - NB/NB. Reported chills and hot flashes. Reported that she's used nothing for pain. Denied fevers, chest pain, shortness of breath, difficulty breathing, diarrhea, melena, hematochezia, dysuria, vaginal pain, vaginal discharge. Patient currently menstruating.  PCP none  History reviewed. No pertinent past medical history. Past Surgical History  Procedure Laterality Date  . Appendectomy     No family history on file. History  Substance Use Topics  . Smoking status: Current Every Day Smoker  . Smokeless tobacco: Not on file  . Alcohol Use: Yes   OB History   Grav Para Term Preterm Abortions TAB SAB Ect Mult Living                 Review of Systems  Constitutional: Positive for chills. Negative for fever.  Respiratory: Negative for chest tightness and shortness of breath.   Cardiovascular: Negative for chest pain.  Gastrointestinal: Positive for nausea, vomiting and abdominal pain. Negative for diarrhea, constipation, blood in stool and anal bleeding.  Musculoskeletal: Positive for back pain.  Neurological:  Negative for dizziness and weakness.  All other systems reviewed and are negative.    Allergies  Review of patient's allergies indicates no known allergies.  Home Medications   Current Outpatient Rx  Name  Route  Sig  Dispense  Refill  . etonogestrel (IMPLANON) 68 MG IMPL implant   Subcutaneous   Inject 1 each into the skin once. Due to be removed this year         . ondansetron (ZOFRAN) 4 MG tablet   Oral   Take 1 tablet (4 mg total) by mouth every 6 (six) hours.   12 tablet   0    BP 119/68  Pulse 60  Temp(Src) 98 F (36.7 C) (Oral)  Resp 16  SpO2 100%  LMP 11/16/2013 Physical Exam  Nursing note and vitals reviewed. Constitutional: She is oriented to person, place, and time. She appears well-developed and well-nourished. No distress.  Patient found walking towards her bed hunched over, crying because she is in a lot of pain  HENT:  Head: Normocephalic and atraumatic.  Mouth/Throat: Oropharynx is clear and moist. No oropharyngeal exudate.  Eyes: Conjunctivae and EOM are normal. Pupils are equal, round, and reactive to light. Right eye exhibits no discharge. Left eye exhibits no discharge.  Neck: Normal range of motion. Neck supple.  Cardiovascular: Normal rate and regular rhythm.  Exam reveals no friction rub.   No murmur heard. Pulmonary/Chest: Effort normal. No respiratory distress. She has no wheezes. She has no rales.  Abdominal: Soft. Normal appearance and bowel sounds are normal. She exhibits no distension. There is tenderness. There is guarding. There is no rebound.    Discomfort upon palpation to  bilateral lower quadrants and suprapubic tenderness noted  Genitourinary: Vagina normal. No vaginal discharge found.  Negative swelling, erythema, inflammation, lesions, sores noted to the external genitalia. Negative swelling, erythema, inflammation, lesions noted to the vaginal canal. Cervix identified with negative erythema, swelling, strawberry appearance.  Patient currently menstruating. Bright red blood in vaginal vault. Negative CMT. Bilateral adnexal tenderness-most discomfort upon palpation to left adnexal region. Exam chaperoned with RN  Musculoskeletal: Normal range of motion. She exhibits no tenderness.  Lymphadenopathy:    She has no cervical adenopathy.  Neurological: She is alert and oriented to person, place, and time. She exhibits normal muscle tone. Coordination normal.  Skin: Skin is warm and dry. No rash noted. She is not diaphoretic. No erythema.  Psychiatric: She has a normal mood and affect. Her behavior is normal. Thought content normal.    ED Course  Procedures (including critical care time)  1:15 PM Dr. Alden Server saw and assessed patient. Agreed to this being more of a GYN issue. Recommended patient to be PO fluid challenged and if okay can be discharged.   2:08 PM This provider spoke with patient regarding labs and imaging. Patient reported that she is feeling better, denied abdominal pain.   Results for orders placed during the hospital encounter of 11/21/13  WET PREP, GENITAL      Result Value Range   Yeast Wet Prep HPF POC NONE SEEN  NONE SEEN   Trich, Wet Prep NONE SEEN  NONE SEEN   Clue Cells Wet Prep HPF POC NONE SEEN  NONE SEEN   WBC, Wet Prep HPF POC NONE SEEN  NONE SEEN  URINALYSIS, ROUTINE W REFLEX MICROSCOPIC      Result Value Range   Color, Urine YELLOW  YELLOW   APPearance CLOUDY (*) CLEAR   Specific Gravity, Urine 1.023  1.005 - 1.030   pH 6.0  5.0 - 8.0   Glucose, UA NEGATIVE  NEGATIVE mg/dL   Hgb urine dipstick LARGE (*) NEGATIVE   Bilirubin Urine NEGATIVE  NEGATIVE   Ketones, ur NEGATIVE  NEGATIVE mg/dL   Protein, ur NEGATIVE  NEGATIVE mg/dL   Urobilinogen, UA 0.2  0.0 - 1.0 mg/dL   Nitrite NEGATIVE  NEGATIVE   Leukocytes, UA SMALL (*) NEGATIVE  PREGNANCY, URINE      Result Value Range   Preg Test, Ur NEGATIVE  NEGATIVE  CBC WITH DIFFERENTIAL      Result Value Range   WBC 3.9 (*) 4.0  - 10.5 K/uL   RBC 4.09  3.87 - 5.11 MIL/uL   Hemoglobin 11.6 (*) 12.0 - 15.0 g/dL   HCT 16.1 (*) 09.6 - 04.5 %   MCV 84.8  78.0 - 100.0 fL   MCH 28.4  26.0 - 34.0 pg   MCHC 33.4  30.0 - 36.0 g/dL   RDW 40.9  81.1 - 91.4 %   Platelets 228  150 - 400 K/uL   Neutrophils Relative % 48  43 - 77 %   Neutro Abs 1.9  1.7 - 7.7 K/uL   Lymphocytes Relative 42  12 - 46 %   Lymphs Abs 1.6  0.7 - 4.0 K/uL   Monocytes Relative 7  3 - 12 %   Monocytes Absolute 0.3  0.1 - 1.0 K/uL   Eosinophils Relative 3  0 - 5 %   Eosinophils Absolute 0.1  0.0 - 0.7 K/uL   Basophils Relative 1  0 - 1 %   Basophils Absolute 0.0  0.0 - 0.1 K/uL  COMPREHENSIVE METABOLIC PANEL      Result Value Range   Sodium 140  137 - 147 mEq/L   Potassium 4.1  3.7 - 5.3 mEq/L   Chloride 102  96 - 112 mEq/L   CO2 25  19 - 32 mEq/L   Glucose, Bld 96  70 - 99 mg/dL   BUN 8  6 - 23 mg/dL   Creatinine, Ser 8.29  0.50 - 1.10 mg/dL   Calcium 9.2  8.4 - 56.2 mg/dL   Total Protein 7.8  6.0 - 8.3 g/dL   Albumin 4.6  3.5 - 5.2 g/dL   AST 16  0 - 37 U/L   ALT 7  0 - 35 U/L   Alkaline Phosphatase 49  39 - 117 U/L   Total Bilirubin 0.3  0.3 - 1.2 mg/dL   GFR calc non Af Amer >90  >90 mL/min   GFR calc Af Amer >90  >90 mL/min  LIPASE, BLOOD      Result Value Range   Lipase 37  11 - 59 U/L  URINE MICROSCOPIC-ADD ON      Result Value Range   Squamous Epithelial / LPF RARE  RARE   WBC, UA 3-6  <3 WBC/hpf   RBC / HPF 0-2  <3 RBC/hpf   Bacteria, UA RARE  RARE   Urine-Other MUCOUS PRESENT     US Pelvis Complete  11/21/2013   CLINICAL DATA:  Pelvic pain.  Rule out torsion.  EXAM: TRANSABDOMINAL AND TRANSVAGINAL ULTRASOUND OF PELVIS  DOPPLER ULTRASOUND OF OVARIES  TECHNIQUE: Both transabdominal and transvaginal ultrasound examinations of the pelvis were performed. Transabdominal technique was performed for global imaging of the pelvis including uterus, ovaries, adnexal regions, and pelvic cul-de-sac.  It was necessary to proceed with  endovaginal exam following the transabdominal exam to visualize the ovaries. Color and duplex Doppler ultrasound was utilized to evaluate blood flow to the ovaries.  COMPARISON:  None.  CT abdomen and pelvis 07/28/10  FINDINGS: Uterus  Measurements: 8.2 x 4.5 x 5.3 cm. No fibroids or other mass visualized.  Endometrium  Thickness: 3.5 mm, within normal limits. No focal abnormality visualized.  Right ovary  Measurements: 5.2 x 1.8 x 3.2 cm, within normal limits. Normal appearance/no adnexal mass. A benign appearing enlarged follicle measures 2.6 x 1.6 x 2.1 cm.  Left ovary  Measurements: 4.1 x 2.0 x 2.3 cm, within normal limits. Normal appearance/no adnexal mass.  Other findings  A small amount of free fluid is likely physiologic.  Pulsed Doppler evaluation of both ovaries demonstrates normal low-resistance arterial and venous waveforms.  IMPRESSION: 1. Normal appearance of the uterus and ovaries bilaterally without evidence for torsion. 2. A benign appearing prominent follicle of the right ovary measures 2.6 x 1.6 x 2.1 cm. 3. A smaller free fluid is likely physiologic.   Electronically Signed   By: Gennette Pac M.D.   On: 11/21/2013 13:12    Labs Review Labs Reviewed  URINALYSIS, ROUTINE W REFLEX MICROSCOPIC - Abnormal; Notable for the following:    APPearance CLOUDY (*)    Hgb urine dipstick LARGE (*)    Leukocytes, UA SMALL (*)    All other components within normal limits  CBC WITH DIFFERENTIAL - Abnormal; Notable for the following:    WBC 3.9 (*)    Hemoglobin 11.6 (*)    HCT 34.7 (*)    All other components within normal limits  WET PREP, GENITAL  GC/CHLAMYDIA PROBE AMP  URINE CULTURE  PREGNANCY, URINE  COMPREHENSIVE METABOLIC PANEL  LIPASE, BLOOD  URINE MICROSCOPIC-ADD ON   Imaging Review [place] Pelvis Complete  11/21/2013   CLINICAL DATA:  Pelvic pain.  Rule out torsion.  EXAM: TRANSABDOMINAL AND TRANSVAGINAL ULTRASOUND OF PELVIS  DOPPLER ULTRASOUND OF OVARIES  TECHNIQUE: Both  transabdominal and transvaginal ultrasound examinations of the pelvis were performed. Transabdominal technique was performed for global imaging of the pelvis including uterus, ovaries, adnexal regions, and pelvic cul-de-sac.  It was necessary to proceed with endovaginal exam following the transabdominal exam to visualize the ovaries. Color and duplex Doppler ultrasound was utilized to evaluate blood flow to the ovaries.  COMPARISON:  None.  CT abdomen and pelvis 07/28/10  FINDINGS: Uterus  Measurements: 8.2 x 4.5 x 5.3 cm. No fibroids or other mass visualized.  Endometrium  Thickness: 3.5 mm, within normal limits. No focal abnormality visualized.  Right ovary  Measurements: 5.2 x 1.8 x 3.2 cm, within normal limits. Normal appearance/no adnexal mass. A benign appearing enlarged follicle measures 2.6 x 1.6 x 2.1 cm.  Left ovary  Measurements: 4.1 x 2.0 x 2.3 cm, within normal limits. Normal appearance/no adnexal mass.  Other findings  A small amount of free fluid is likely physiologic.  Pulsed Doppler evaluation of both ovaries demonstrates normal low-resistance arterial and venous waveforms.  IMPRESSION: 1. Normal appearance of the uterus and ovaries bilaterally without evidence for torsion. 2. A benign appearing prominent follicle of the right ovary measures 2.6 x 1.6 x 2.1 cm. 3. A smaller free fluid is likely physiologic.   Electronically Signed   By: Gennette Pachris  Mattern M.D.   On: 11/21/2013 13:12    EKG Interpretation   None       MDM   1. Abdominal pain   2. Nausea     Medications  0.9 %  sodium chloride infusion ( Intravenous Stopped 11/21/13 1446)  ondansetron (ZOFRAN) injection 4 mg (4 mg Intravenous Given 11/21/13 1020)   Filed Vitals:   11/21/13 0942 11/21/13 1305 11/21/13 1331 11/21/13 1400  BP: 141/82 125/61  119/68  Pulse: 86 68 57 60  Temp: 96.9 F (36.1 C)  98 F (36.7 C)   TempSrc: Oral  Oral   Resp: 20 16 16    SpO2: 100% 100% 100% 100%    Patient presenting to emergency  department with abdominal pain that has been ongoing for the past 2 days localized to the lower quadrants bilaterally. Described discomfort as a constant sharp shooting pain with radiation to the lower back. Patient reports that she's used nothing for pain. Stated that she had a similar episode of abdominal pain in 10/2013 - reported very similar symptoms.  Alert oriented. GCS 15. Heart rate and rhythm normal. Radial pulses 2+ bilaterally. Lungs clear to auscultation. Full range of motion to upper and lower extremities bilaterally. Bowel sounds normoactive in all 4 quadrants. Discomfort upon palpation to the left lower quadrant and right lower quadrant. Patient currently menstruating. Negative CMT. Bilateral adnexal tenderness-most discomfort upon palpation to the left adnexal region. Urinalysis noted large hemoglobin small leukocytes-patient currently menstruating. Urine pregnancy negative. Negative pyuria or bacteria noted in urine. CBC noted low white blood cell of 3.9. CMP negative findings. Lipase negative elevation. Wet prep negative for infections. GC/Chlamydia pending. US of pelvis normal appearance of the uterus - negative findings for ovarian torsion. Negative acute abnormalities noted.  Doubt TOA. Doubt ovarian torsion. Doubt appendicitis. Patient presenting to the ED with abdominal pain with unknown etiology. Pain controlled in ED setting. Discussed labs and imaging  in great detail with patient. Patient tolerated PO fluids and crackers without difficulty - negative episodes of emesis while in ED setting. Patient stable, afebrile. Discharged patient. Referred patient to OBGYN and GI. Discussed with patient to rest and stay hydrated. Discussed with patient to refrain from eating anything high in fat, grease, spicy - discussed diet. Discharged patient with zofran. Discussed with patient to closely monitor symptoms and if symptoms are to worsen or change to report back to the ED - strict return  instructions given.  Patient agreed to plan of care, understood, all questions answered.   Raymon Mutton, PA-C 11/21/13 2146

## 2013-11-21 NOTE — ED Provider Notes (Signed)
Complains of infraumbilical pain onset 2 days ago, gradual onset. Patient has vomited twice since onset of pain. She denies fever. Had similar episode in November 2014. Resolve spontaneously. Presently discomfort is mild. She denies nausea. On exam no distress. Abdomen nondistended normal bowel sounds minimally tender at infraumbilical area no guarding rigidity or rebound  Doug SouSam Larron Armor, MD 11/21/13 1355

## 2013-11-22 LAB — URINE CULTURE: Colony Count: 45000

## 2013-11-22 NOTE — ED Provider Notes (Signed)
Medical screening examination/treatment/procedure(s) were conducted as a shared visit with non-physician practitioner(s) and myself.  I personally evaluated the patient during the encounter.  EKG Interpretation   None        Doug SouSam Skylar Flynt, MD 11/22/13 [phone]

## 2013-11-23 LAB — GC/CHLAMYDIA PROBE AMP
CT PROBE, AMP APTIMA: POSITIVE — AB
GC PROBE AMP APTIMA: NEGATIVE

## 2013-11-24 NOTE — ED Notes (Signed)
Chart sent to EDP office for review.+ Chlamydia 

## 2013-11-25 ENCOUNTER — Telehealth (HOSPITAL_COMMUNITY): Payer: Self-pay | Admitting: *Deleted

## 2013-11-25 NOTE — ED Notes (Signed)
Patient informed of positive results after id'd x 2 and informed of need to notify partner to be treated.rx called to [facility] [address]

## 2013-11-25 NOTE — ED Notes (Signed)
Chart returned from EDP office with orders written for 1 gram Azithromycin po x 1 dose by Jimmey RalphParker

## 2013-11-27 ENCOUNTER — Telehealth (HOSPITAL_COMMUNITY): Payer: Self-pay

## 2013-11-27 NOTE — ED Notes (Signed)
[facility] w[facility] calling to verify pts date of birth.

## 2013-12-23 ENCOUNTER — Encounter (HOSPITAL_COMMUNITY): Payer: Self-pay | Admitting: Emergency Medicine

## 2013-12-23 ENCOUNTER — Emergency Department (HOSPITAL_COMMUNITY)
Admission: EM | Admit: 2013-12-23 | Discharge: 2013-12-23 | Discharge status: Against Medical Advice | Payer: MEDICARE | Attending: Emergency Medicine | Admitting: Emergency Medicine

## 2013-12-23 DIAGNOSIS — S81009A Unspecified open wound, unspecified knee, initial encounter: Secondary | ICD-10-CM | POA: Insufficient documentation

## 2013-12-23 DIAGNOSIS — Y939 Activity, unspecified: Secondary | ICD-10-CM | POA: Insufficient documentation

## 2013-12-23 DIAGNOSIS — W010XXA Fall on same level from slipping, tripping and stumbling without subsequent striking against object, initial encounter: Secondary | ICD-10-CM | POA: Insufficient documentation

## 2013-12-23 DIAGNOSIS — Y929 Unspecified place or not applicable: Secondary | ICD-10-CM | POA: Insufficient documentation

## 2013-12-23 DIAGNOSIS — S81809A Unspecified open wound, unspecified lower leg, initial encounter: Principal | ICD-10-CM

## 2013-12-23 DIAGNOSIS — S91009A Unspecified open wound, unspecified ankle, initial encounter: Principal | ICD-10-CM

## 2013-12-23 NOTE — ED Notes (Signed)
Pt reports today she tripped and fell into glass coffee table. Has laceration to right knee, lower right leg and lower left leg. Pt anxious, hyperventilating, crying. Bleeding controlled. Movement intact.

## 2013-12-23 NOTE — ED Notes (Signed)
No answer when called x 2

## 2013-12-23 NOTE — ED Notes (Signed)
Pt did not answer x 1 

## 2014-06-06 ENCOUNTER — Emergency Department (HOSPITAL_COMMUNITY)
Admission: EM | Admit: 2014-06-06 | Discharge: 2014-06-06 | Disposition: A | Discharge status: Home / Self Care | Payer: MEDICARE | Attending: Emergency Medicine | Admitting: Emergency Medicine

## 2014-06-06 ENCOUNTER — Emergency Department (HOSPITAL_COMMUNITY): Payer: MEDICARE

## 2014-06-06 ENCOUNTER — Encounter (HOSPITAL_COMMUNITY): Payer: Self-pay | Admitting: Emergency Medicine

## 2014-06-06 DIAGNOSIS — Z79899 Other long term (current) drug therapy: Secondary | ICD-10-CM | POA: Insufficient documentation

## 2014-06-06 DIAGNOSIS — Z792 Long term (current) use of antibiotics: Secondary | ICD-10-CM | POA: Insufficient documentation

## 2014-06-06 DIAGNOSIS — W268XXA Contact with other sharp object(s), not elsewhere classified, initial encounter: Secondary | ICD-10-CM | POA: Insufficient documentation

## 2014-06-06 DIAGNOSIS — M79672 Pain in left foot: Secondary | ICD-10-CM

## 2014-06-06 DIAGNOSIS — IMO0002 Reserved for concepts with insufficient information to code with codable children: Secondary | ICD-10-CM | POA: Insufficient documentation

## 2014-06-06 DIAGNOSIS — S99929A Unspecified injury of unspecified foot, initial encounter: Principal | ICD-10-CM

## 2014-06-06 DIAGNOSIS — Z23 Encounter for immunization: Secondary | ICD-10-CM | POA: Insufficient documentation

## 2014-06-06 DIAGNOSIS — Y929 Unspecified place or not applicable: Secondary | ICD-10-CM | POA: Insufficient documentation

## 2014-06-06 DIAGNOSIS — S8990XA Unspecified injury of unspecified lower leg, initial encounter: Secondary | ICD-10-CM | POA: Insufficient documentation

## 2014-06-06 DIAGNOSIS — Y9389 Activity, other specified: Secondary | ICD-10-CM | POA: Insufficient documentation

## 2014-06-06 DIAGNOSIS — Z3202 Encounter for pregnancy test, result negative: Secondary | ICD-10-CM | POA: Insufficient documentation

## 2014-06-06 DIAGNOSIS — S99919A Unspecified injury of unspecified ankle, initial encounter: Principal | ICD-10-CM

## 2014-06-06 LAB — POC URINE PREG, ED: Preg Test, Ur: NEGATIVE

## 2014-06-06 MED ORDER — CIPROFLOXACIN HCL 250 MG PO TABS: 750.0000 mg | ORAL_TABLET | Freq: Two times a day (BID) | ORAL | Status: DC

## 2014-06-06 MED ORDER — TETANUS-DIPHTH-ACELL PERTUSSIS 5-2.5-18.5 LF-MCG/0.5 IM SUSP
0.5000 mL | Freq: Once | INTRAMUSCULAR | Status: AC
  Administered 2014-06-06: 0.5 mL via INTRAMUSCULAR
  Filled 2014-06-06: qty 0.5

## 2014-06-06 NOTE — ED Provider Notes (Signed)
CSN: [id number]     Arrival date & time 06/06/14  1635 History   None    This chart was scribed for non-physician practitioner, Raymon MuttonMarissa Johanna Matto PA-C working with Glynn OctaveStephen Rancour, MD by Arlan OrganAshley Leger, ED Scribe. This patient was seen in room TR11C/TR11C and the patient's care was started at 5:26 PM.   Chief Complaint  Patient presents with  . Foot Pain    Left foot   The history is provided by the patient. No language interpreter was used.    HPI Comments: Elaine Gonzalez is a 23 y.o. female who presents to the Emergency Department complaining of constant, moderate L foot pain x 3 hours that is unchanged. She describes pain when standing as sharp/throbbing and rates it 4-5/10. Pt states she may have stepped on a nail around 3:00 today while at work - patient works in [employer].  Pain is exacerbated with palpation and standing. She denies any bleeding or rashes to the bottom of the foot. She denies any fever or chills. No numbness, loss of sensation, or paresthesia. She is due for a Tetanus shot today. No known allergies to medications. She has no pertinent past medical history. No other concerns this visit.   History reviewed. No pertinent past medical history. Past Surgical History  Procedure Laterality Date  . Appendectomy     No family history on file. History  Substance Use Topics  . Smoking status: Current Every Day Smoker  . Smokeless tobacco: Not on file  . Alcohol Use: No     Comment: former   OB History   Grav Para Term Preterm Abortions TAB SAB Ect Mult Living                 Review of Systems  Constitutional: Negative for fever and chills.  Musculoskeletal: Positive for arthralgias (L knee).  Neurological: Negative for weakness and numbness.      Allergies  Review of patient's allergies indicates no known allergies.  Home Medications   Prior to Admission medications   Medication Sig Start Date End Date Taking? Authorizing Provider  etonogestrel (IMPLANON) 68  MG IMPL implant Inject 1 each into the skin once. Due to be removed this year   Yes Historical Provider, MD  ibuprofen (ADVIL,MOTRIN) 200 MG tablet Take 200 mg by mouth every 6 (six) hours as needed for moderate pain.   Yes Historical Provider, MD  ciprofloxacin (CIPRO) 250 MG tablet Take 3 tablets (750 mg total) by mouth every 12 (twelve) hours. 06/06/14   Mae Cianci, PA-C   Triage Vitals: BP 125/67  Pulse 77  Temp(Src) 98.5 F (36.9 C) (Oral)  Resp 18  Ht 5\' 6"  (1.676 m)  Wt 130 lb (58.968 kg)  BMI 20.99 kg/m2  SpO2 100%  LMP 05/02/2014   Physical Exam  Nursing note and vitals reviewed. Constitutional: She is oriented to person, place, and time. She appears well-developed and well-nourished.  HENT:  Head: Normocephalic and atraumatic.  Mouth/Throat: Oropharynx is clear and moist. No oropharyngeal exudate.  Eyes: Conjunctivae and EOM are normal. Pupils are equal, round, and reactive to light.  Neck: Normal range of motion. Neck supple. No tracheal deviation present.  Cardiovascular: Normal rate, regular rhythm and normal heart sounds.  Exam reveals no friction rub.   No murmur heard. Pulmonary/Chest: Effort normal and breath sounds normal. No respiratory distress. She has no wheezes. She has no rales.  Abdominal: She exhibits no distension.  Musculoskeletal: Normal range of motion. She exhibits tenderness.  Feet:  Negative swelling, erythema, inflammation, lesions, sores, deformities identified to the left foot. Negative erythema, warmth upon palpation. Negative active drainage or bleeding noted. Brown discoloration identified to the lower portion of her foot - mild areas of pinpoint discoloration noted at the plantar aspect at the base of the pinky toe. Discomfort upon palpation to these regions. Negative active drainage or bleeding. Full range of motion to the distal left foot. Full range of motion to the ankle.  Lymphadenopathy:    She has no cervical adenopathy.   Neurological: She is alert and oriented to person, place, and time. No cranial nerve deficit. She exhibits normal muscle tone. Coordination normal.  Cranial nerves III-XII grossly intact Strength 5+/5+ to lower extremities bilaterally with resistance applied, equal distribution noted Strength intact to digits of the left foot Sensation intact with differentiation to sharp and dull touch  Skin: No rash noted. No erythema.  Please see musculoskeletal  Psychiatric: She has a normal mood and affect. Her behavior is normal. Thought content normal.    ED Course  Procedures (including critical care time)  DIAGNOSTIC STUDIES: Oxygen Saturation is 100% on RA, Normal by my interpretation.    COORDINATION OF CARE: 5:31 PM- Will order DG Foot complete L and POC urine preg. Discussed treatment plan with pt at bedside and pt agreed to plan.     Labs Review Labs Reviewed  POC URINE PREG, ED    Imaging Review Dg Foot Complete Left  06/06/2014   CLINICAL DATA:  Pain and bruising in the plantar aspect of the left foot in the region of the fifth MTP joint after stepping on an object.  EXAM: LEFT FOOT - COMPLETE 3+ VIEW  COMPARISON:  None.  FINDINGS: Normal appearing bones and soft tissues. No fracture or radiopaque foreign body seen.  IMPRESSION: Normal examination.  No fracture or radiopaque foreign body seen.   Electronically Signed   By: Gordan Payment M.D.   On: 06/06/2014 17:52     EKG Interpretation None      MDM   Final diagnoses:  Left foot pain    Medications  Tdap (BOOSTRIX) injection 0.5 mL (0.5 mLs Intramuscular Given 06/06/14 1818)     Filed Vitals:   06/06/14 1649  BP: 125/67  Pulse: 77  Temp: 98.5 F (36.9 C)  TempSrc: Oral  Resp: 18  Height: 5\' 6"  (1.676 m)  Weight: 130 lb (58.968 kg)  SpO2: 100%   I personally performed the services described in this documentation, which was scribed in my presence. The recorded information has been reviewed and is accurate.  Urine  pregnancy negative. Plain film of left foot negative for acute osseous injury. No fracture or radiopaque foreign body identified. Pulses palpable and strong. Sensation intact. Full range of motion without difficulty or ataxia noted.  Patient presenting with pinpoint discoloration identified to the plantar aspect of the feet-cannot rule out possible penetration from a foreign body - no signs of definitive puncture wounds. Negative foreign bodies identified on the plain film. Tetanus administered, patient able to recall one last tetanus was given. Will treat patient for pseudomonal infection with ciprofloxacin. Patient stable, afebrile. Patient not septic appearing. Discharged patient. Referred to [facility] wellness [facility]. Discussed with patient to rest and stay hydrated. Discussed with patient proper wound care bandage. Discussed with patient to closely monitor symptoms and if symptoms are to worsen or change to report back to the ED - strict return instructions given.  Patient agreed to plan of care,  understood, all questions answered.   Raymon Mutton, PA-C 06/07/14 1253

## 2014-06-06 NOTE — Discharge Instructions (Signed)
Please call your doctor for a followup appointment within 24-48 hours. When you talk to your doctor please let them know that you were seen in the emergency department and have them acquire all of your records so that they can discuss the findings with you and formulate a treatment plan to fully care for your new and ongoing problems. Please call and set up appointment with your primary care provider Please rest and stay hydrated Please massage the sites Please wear proper dressings Please closely monitor and if symptoms are to worsen such as swelling, redness, numbness, tingling, drainage, hot to the touch, red streaks of the foot please start the antibiotics then. These continue to monitor symptoms closely if symptoms are to worsen or change (fever greater than 101, chills, chest pain, shortness of breath, difficulty breathing, numbness, tingling, worsening or changes to pain pattern, inability to apply pressure, weakness numbness and tingling, swelling to the foot, changes to skin colored, abscess formation, red streaks, fall or injury) please report back to the ED immediately   Emergency Department Resource Guide 1) Find a Doctor and Pay Out of Pocket Although you won't have to find out who is covered by your insurance plan, it is a good idea to ask around and get recommendations. You will then need to call the office and see if the doctor you have chosen will accept you as a new patient and what types of options they offer for patients who are self-pay. Some doctors offer discounts or will set up payment plans for their patients who do not have insurance, but you will need to ask so you aren't surprised when you get to your appointment.  2) Contact Your Local Health Department Not all health departments have doctors that can see patients for sick visits, but many do, so it is worth a call to see if yours does. If you don't know where your local health department is, you can check in your phone  book. The CDC also has a tool to help you locate your state's health department, and many state websites also have listings of all of their local health departments.  3) Find a Walk-in Clinic If your illness is not likely to be very severe or complicated, you may want to try a walk in clinic. These are popping up all over the country in pharmacies, drugstores, and shopping centers. They're usually staffed by nurse practitioners or physician assistants that have been trained to treat common illnesses and complaints. They're usually fairly quick and inexpensive. However, if you have serious medical issues or chronic medical problems, these are probably not your best option.  No Primary Care Doctor: - Call Health Connect at  [phone] - they can help you locate a primary care doctor that  accepts your insurance, provides certain services, etc. - Physician Referral Service- [phone]  Chronic Pain Problems: Organization         Address  Phone   Notes  [facility]  [phone] Patients need to be referred by their primary care doctor.   Medication Assistance: Organization         Address  Phone   Notes  [facility] Medication [facility] [address] [place], [place] [address] [phone] --Must be a resident of [facility] -- Must have NO insurance coverage whatsoever (no Medicaid/ Medicare, etc.) -- The pt. MUST have a primary care doctor that directs their care regularly and follows them in the community  MedAssist  [phone]) [phone]   [employer]  [phone]    Agencies that provide inexpensive medical care: Organization         Address  Phone   Notes  [facility]  [phone]) [phone]   [facility]    [phone]   [facility] [address] [place], [place] [address] [phone]   [facility] [address], [place] [phone]   [facility]    [phone]   [facility]    [phone]   [facility]  [address], [place] Phone:  [phone], Fax:  [phone] Hours of Operation:  9 am - 6 pm, M-F.  Also accepts Medicaid/Medicare and self-pay.  [facility]  [address], [place] Phone: [phone]) [phone], Fax: [phone]. Hours of Operation:  8:30 am - 5:30 pm, M-F.  Also accepts Medicaid and self-pay.  [facility] [place] [address], [place] Phone: [phone]) [phone]   [facility] [address], [place], [place] [phone], Ext. [phone] Mondays & Thursdays: 7-9 AM.  First 15 patients are seen on a first come, first serve basis.    Medicaid-accepting [facility] Providers:  Organization         Address  Phone   Notes  [facility] [address], [place] [phone]) [phone] Also accepts self-pay patients.  [facility] [address], [place]  [phone]) [phone]   [facility] [address], [place] [phone]) [phone]   [facility] [address] [place] [address], [place] [phone]   Renaye RakersVeita Bland [address], [place]   [phone] Only accepts [place] Access [place] patients after they have their name applied to their card.   Self-Pay (no insurance) in [facility]:  Organization         Address  Phone   Notes  Sickle Cell Patients, [facility] [address], [place] [phone]) [phone]   [facility] [address], [place] [phone]) [phone]   [facility] [place]  [address], [place] [phone]) [phone]   [facility]/Dr. Osei-Bonsu  [address] [place] [address], [place] or [phone], [place] [phone] Phone number for both [place] and [place] locations is the same.    [facility] [address], [place] [phone]   [facility]  [address] [place] [address], [place] or [address] [phone]   [facility] [address], [place] [phone]) [phone], phone; [phone]) [phone], fax Sees patients 1st and 3rd Saturday of every month.  Must not qualify for public or private insurance (i.e. Medicaid, Medicare, [place] Health Choice, Veterans' Benefits)  Household income should be no more than 200% of the poverty level The clinic cannot treat you if you are pregnant or think you are pregnant  Sexually transmitted diseases are not treated at the clinic.    Dental Care: Organization         Address  Phone  Notes  [facility] of [facility] [address], [place] [phone]) [phone] Accepts children up to age 22 who are enrolled in [place] or [place] Health  Choice; pregnant women with a Medicaid card; and children who have applied for Medicaid or [place] Health Choice, but were declined, whose parents can pay a reduced fee at time of service.  [facility]  [address], [place] [phone] Accepts children up to age 7 who are enrolled in [place] or [place] Health Choice; pregnant women with a Medicaid card; and children who have applied for Medicaid or  Health Choice, but were declined, whose parents can pay a reduced fee at time of service.  [facility]  [address] [place], [place] [phone] Patients are seen by appointment only. Walk-ins are not accepted. [facility] will see patients 59 years of age and older. Monday - Tuesday (8am-5pm) Most Wednesdays (8:30-5pm) $30 per visit, cash only  [facility]  [address], [facility] [phone] Patients are seen by appointment only. Walk-ins are not accepted. [facility] will see patients 62  years of age and older. One Wednesday Evening (Monthly: Volunteer Based).  $30 per visit, cash only  [employer]  [phone] for adults; Children under age 71, call Graduate Pediatric Dentistry at [phone]. Children aged 105-14, please call [phone] to request a pediatric application.  Dental services are provided in all areas of dental care including fillings, crowns and bridges, complete and partial dentures, implants, gum treatment, root canals, and extractions. Preventive care is also provided. Treatment is provided to both adults and children. Patients are selected via a lottery and there is often a waiting list.   [facility] [address], [place]  [phone] [email or web address]   [facility] [address] [place], [place] [phone], Ext. [phone] Second and Fourth Thursday of each month, opens at 6:30 AM; Clinic ends at 9 AM.  Patients are seen on a first-come first-served basis, and a limited number are seen during each clinic.   [facility]  [address] [place], [place] [phone]   Eligibility Requirements You must have lived in [place], [place], or [place] counties for at least the last three months.   You cannot be eligible for state or federal sponsored [place], including [employer], [place], or [employer].   You generally cannot be eligible for healthcare insurance through your employer.    How to apply: Eligibility screenings are held every Tuesday and Wednesday afternoon from 1:00 pm until 4:00 pm. You do not need an appointment for the interview!  [facility] [address], [place], [place] [phone]   [facility] Health Department  [phone]   [facility] Health Department  [phone]   [facility]  [phone]    Behavioral Health Resources in the Community: Intensive Outpatient  Programs Organization         Address  Phone  Notes  [facility] [address] [place], [place] [phone]   [facility] Outpatient [address], [place], [place] [phone]   [facility] [address] [place], [place]  [phone]   [facility] Mental Health [address]  [place], [place] [phone] or [phone]   Substance Abuse Resources Organization         Address  Phone  Notes  [facility] Drug Services  [phone]   [facility]  [phone]   [facility]  [phone]  Floydene Flock  [phone]   Residential & Outpatient Substance Abuse Program  [phone]   Psychological Services Organization         Address  Phone  Notes  [facility]  [phone]   [facility]  [phone]   [facility] Mental Health [address] [place] [phone] or [phone]    Mobile Crisis Teams Organization         Address  Phone  Notes  [facility], Mobile Crisis Care Unit  [phone]   [facility]  [address] [place], [place] [phone]   Doristine Locks [address] [place] [phone]    Self-Help/Support Groups Organization         Address  Phone             Notes  [facility] [place] - variety of support groups  336- I7437963 Call for more information  Narcotics Anonymous (NA), [facility] [address], [employer] [place]  2 meetings at this location   Statistician         Address  Phone  Notes  [facility] [address],    [place]  [phone]   [facility]  [address], [place] [address], [place], [place] [phone]   [facility] [address] [place], [place] [phone] Admissions: 8am-3pm M-F  [facility] [address]    [place], [place]  [phone]   [facility] [address] [place], [place], [place] [phone]   [facility] [address],  [place], [place] [phone]   [facility] - Intensive Outpatient [address], [place], [place] [phone]   [facility] ([facility]iction [facility] [address]  [place], [place] [phone] or [phone]   [facility] (RTS) [address] [place], [place] [phone] Accepts Medicaid  [facility] [address]  [place] [phone] Substance Abuse/Addiction Treatment   [facility] Organization         Address  Phone  Notes  [facility]  [phone]   Angie Fava, PhD [address] [place], [place]   [phone] or [phone]   [facility] Behavioral   [address] [place], [place] [phone]   [facility] [address], [place], [place] [phone] Insurance/Medicaid/sponsorship through [facility] and Families [address]                                    [place], [place] [phone] Therapy/tele-psych/case  [facility] [address] [place], [place] [phone]    Dr. Lolly Mustache  [phone]   [facility]  [employer] [facility] Dept. 1) [address] [place] 2) [address], [place] 3)  [address], [place] [phone]  [phone]   [facility] Child Abuse Hotline 732-372-0516 or [phone] (After Hours)

## 2014-06-06 NOTE — ED Notes (Addendum)
Pt reports that she may have stepped on a nail around 1500 with left foot. Pt ambulatory with a limp. Pt denies bleeding to bottom of foot. Pt has implant for birth control but was due to be changed last year.

## 2014-06-07 NOTE — ED Provider Notes (Signed)
Medical screening examination/treatment/procedure(s) were performed by non-physician practitioner and as supervising physician I was immediately available for consultation/collaboration.   EKG Interpretation None       Lakesha Levinson, MD 06/07/14 1317 

## 2014-09-11 ENCOUNTER — Emergency Department (HOSPITAL_COMMUNITY)
Admission: EM | Admit: 2014-09-11 | Discharge: 2014-09-11 | Disposition: A | Discharge status: Home / Self Care | Payer: MEDICARE | Attending: Emergency Medicine | Admitting: Emergency Medicine

## 2014-09-11 ENCOUNTER — Encounter (HOSPITAL_COMMUNITY): Payer: Self-pay

## 2014-09-11 DIAGNOSIS — R102 Pelvic and perineal pain: Secondary | ICD-10-CM

## 2014-09-11 DIAGNOSIS — Z720 Tobacco use: Secondary | ICD-10-CM | POA: Insufficient documentation

## 2014-09-11 DIAGNOSIS — Z792 Long term (current) use of antibiotics: Secondary | ICD-10-CM | POA: Insufficient documentation

## 2014-09-11 DIAGNOSIS — Z3202 Encounter for pregnancy test, result negative: Secondary | ICD-10-CM | POA: Insufficient documentation

## 2014-09-11 DIAGNOSIS — N946 Dysmenorrhea, unspecified: Secondary | ICD-10-CM | POA: Insufficient documentation

## 2014-09-11 LAB — POC URINE PREG, ED: PREG TEST UR: NEGATIVE

## 2014-09-11 LAB — URINALYSIS, ROUTINE W REFLEX MICROSCOPIC
BILIRUBIN URINE: NEGATIVE
Glucose, UA: NEGATIVE mg/dL
Ketones, ur: NEGATIVE mg/dL
Nitrite: NEGATIVE
PH: 8 (ref 5.0–8.0)
Protein, ur: 30 mg/dL — AB
SPECIFIC GRAVITY, URINE: 1.022 (ref 1.005–1.030)
Urobilinogen, UA: 0.2 mg/dL (ref 0.0–1.0)

## 2014-09-11 LAB — WET PREP, GENITAL
Trich, Wet Prep: NONE SEEN
Yeast Wet Prep HPF POC: NONE SEEN

## 2014-09-11 LAB — URINE MICROSCOPIC-ADD ON

## 2014-09-11 MED ORDER — KETOROLAC TROMETHAMINE 60 MG/2ML IM SOLN
60.0000 mg | Freq: Once | INTRAMUSCULAR | Status: AC
  Administered 2014-09-11: 60 mg via INTRAMUSCULAR
  Filled 2014-09-11: qty 2

## 2014-09-11 NOTE — ED Notes (Signed)
Dr. Modesto CharonWong Collected the wet prep genital

## 2014-09-11 NOTE — ED Notes (Addendum)
Pt c/o pelvic pain.  Pt reports N/V/D.  Pt states she is menstruating and these are the worst menstrual cramps she's ever had.  Pt crying in triage and rocking back and forth, but is able to text on cell phone.

## 2014-09-11 NOTE — ED Provider Notes (Signed)
CSN: [id number]     Arrival date & time 09/11/14  1204 History   First MD Initiated Contact with Patient 09/11/14 1214     Chief Complaint  Patient presents with  . Pelvic Pain     (Consider location/radiation/quality/duration/timing/severity/associated sxs/prior Treatment) Patient is a 23 y.o. female presenting with female genitourinary complaint. The history is provided by the patient. No language interpreter was used.  Female GU Problem This is a new problem. The current episode started yesterday. The problem occurs constantly. The problem has been unchanged. Associated symptoms include abdominal pain (suprapubic). Pertinent negatives include no chest pain, congestion, coughing, fever, headaches, nausea, numbness, rash, sore throat, urinary symptoms or vomiting. Nothing aggravates the symptoms. She has tried nothing for the symptoms. The treatment provided no relief.    History reviewed. No pertinent past medical history. Past Surgical History  Procedure Laterality Date  . Appendectomy     No family history on file. History  Substance Use Topics  . Smoking status: Current Every Day Smoker  . Smokeless tobacco: Not on file  . Alcohol Use: No     Comment: former   OB History    No data available     Review of Systems  Constitutional: Negative for fever.  HENT: Negative for congestion, rhinorrhea and sore throat.   Respiratory: Negative for cough and shortness of breath.   Cardiovascular: Negative for chest pain.  Gastrointestinal: Positive for abdominal pain (suprapubic). Negative for nausea, vomiting and diarrhea.  Genitourinary: Positive for vaginal pain, menstrual problem and pelvic pain. Negative for dysuria and hematuria.  Skin: Negative for rash.  Neurological: Negative for syncope, light-headedness, numbness and headaches.  All other systems reviewed and are negative.     Allergies  Review of patient's allergies indicates no known allergies.  Home Medications    Prior to Admission medications   Medication Sig Start Date End Date Taking? Authorizing Provider  ciprofloxacin (CIPRO) 250 MG tablet Take 3 tablets (750 mg total) by mouth every 12 (twelve) hours. 06/06/14   Marissa Sciacca, PA-C  etonogestrel (IMPLANON) 68 MG IMPL implant Inject 1 each into the skin once. Due to be removed this year    Historical Provider, MD  ibuprofen (ADVIL,MOTRIN) 200 MG tablet Take 200 mg by mouth every 6 (six) hours as needed for moderate pain.    Historical Provider, MD   BP 139/87 mmHg  Pulse 78  Temp(Src) 97.8 F (36.6 C)  Resp 16  Ht 5\' 6"  (1.676 m)  Wt 140 lb (63.504 kg)  BMI 22.61 kg/m2  SpO2 99%  LMP 09/11/2014 Physical Exam  Constitutional: She is oriented to person, place, and time. She appears well-developed and well-nourished.  HENT:  Head: Normocephalic and atraumatic.  Right Ear: External ear normal.  Left Ear: External ear normal.  Eyes: EOM are normal.  Neck: Normal range of motion. Neck supple.  Cardiovascular: Normal rate, regular rhythm and intact distal pulses.  Exam reveals no gallop and no friction rub.   No murmur heard. Pulmonary/Chest: Effort normal and breath sounds normal. No respiratory distress. She has no wheezes. She has no rales. She exhibits no tenderness.  Abdominal: Soft. Bowel sounds are normal. She exhibits no distension. There is tenderness (suprapubic). There is no rebound.  No peritoneal signs, no guarding  Musculoskeletal: Normal range of motion. She exhibits no edema or tenderness.  Lymphadenopathy:    She has no cervical adenopathy.  Neurological: She is alert and oriented to person, place, and time.  Skin: Skin is  warm. No rash noted.  Psychiatric: She has a normal mood and affect. Her behavior is normal.  Nursing note and vitals reviewed.   ED Course  Procedures (including critical care time) Labs Review Labs Reviewed  WET PREP, GENITAL - Abnormal; Notable for the following:    Clue Cells Wet Prep HPF  POC FEW (*)    WBC, Wet Prep HPF POC FEW (*)    All other components within normal limits  URINALYSIS, ROUTINE W REFLEX MICROSCOPIC - Abnormal; Notable for the following:    APPearance CLOUDY (*)    Hgb urine dipstick LARGE (*)    Protein, ur 30 (*)    Leukocytes, UA SMALL (*)    All other components within normal limits  URINE MICROSCOPIC-ADD ON - Abnormal; Notable for the following:    Squamous Epithelial / LPF FEW (*)    All other components within normal limits  GC/CHLAMYDIA PROBE AMP  POC URINE PREG, ED    Imaging Review No results found.   EKG Interpretation None      MDM   Final diagnoses:  None    12:37 PM Pt is a 23 y.o. female with pertinent PMHX of chlamydia who presents to the ED with pelvic pain and abdominal cramping similar to previous menstrual periods but worse. Endorses intermittent sharp suprapubic cramps. Associated nausea, vomiting and diarrhea. No fevers. Has history of heavy periods. Has irregular periods. History of treated chlamydia this year. Has 1 partner. No previous history of HIV or AIDs. Passing gas. Has had appendix removed. No previous episode of acute obstruction. Endorses vomiting 5 x non bloody non bilious emesis. Endorses diarrhea 5x non blood. No sick contacts. No recent antibiotics or endemic travel or suspect food intake.   On exam: tearful, no rebound or guarding, tenderness over suprapubic region.  Plan for UPT, wet prep, GC, chlam. No vaginal bleeding out of the ordinary. No new sexual partners. Patient's periods irregular.  On pelvic exam: no adnexal tenderness. No CMT tenderness. Pooled blood, no acute bleeding  Low suspicion for TOA or cervicitis or ovarian torsion. Denies discharge will hold off on empirically treating  Review of labs: UPT: negative GC chlam: pending Wet prep: few clue cells, asymptomatic.  UA: large blood likely period related. Small leuks no bacteria  Plan to hold off on treating BV. Patient asymptomatic: no  itching or vaginal discharge. UA no evidence of UTI. No CVA tenderness low suspicion for pyelonephritis. Patient with menstrual cramps.   Plan for discharge with referral to women's hospital. Strict return precautions given. Patient to use tylenol and motrin as needed for pain.  3:31 PM:  I have discussed the diagnosis/risks/treatment options with the patient and believe the pt to be eligible for discharge home to follow-up with women's hospital OB referral. We also discussed returning to the ED immediately if new or worsening sx occur. We discussed the sx which are most concerning (e.g., worsening symptoms) that necessitate immediate return. Any new prescriptions provided to the patient are listed below.   New Prescriptions   No medications on file    The patient appears reasonably screened and/or stabilized for discharge and I doubt any other medical condition or other [facility] requiring further screening, evaluation or treatment in the ED at this time prior to discharge . Pt in agreement with discharge plan. Return precautions given. Pt discharged VSS   Labs, EKG and imaging reviewed by myself and considered in medical decision making if ordered.  Imaging interpreted by radiology. Pt  was discussed with my attending, Dr. Ron Ageeelo     Liseth Wann Peter Woody Kronberg, MD 09/11/14 1547  Geoffery Lyonsouglas Delo, MD 09/12/14 [phone]

## 2014-09-11 NOTE — ED Notes (Signed)
PT unable to get urine specimen at this time

## 2014-09-11 NOTE — ED Notes (Signed)
Resident at bedside doing as pelvic.

## 2014-09-11 NOTE — Discharge Instructions (Signed)
1. Motrin 600 mg three times daily with food and then as needed every 6 hours 2. Call to get OB 3. Come back if worsening symptoms Pelvic Pain Female pelvic pain can be caused by many different things and start from a variety of places. Pelvic pain refers to pain that is located in the lower half of the abdomen and between your hips. The pain may occur over a short period of time (acute) or may be reoccurring (chronic). The cause of pelvic pain may be related to disorders affecting the female reproductive organs (gynecologic), but it may also be related to the bladder, kidney stones, an intestinal complication, or muscle or skeletal problems. Getting help right away for pelvic pain is important, especially if there has been severe, sharp, or a sudden onset of unusual pain. It is also important to get help right away because some types of pelvic pain can be life threatening.  CAUSES  Below are only some of the causes of pelvic pain. The causes of pelvic pain can be in one of several categories.   Gynecologic.  Pelvic inflammatory disease.  Sexually transmitted infection.  Ovarian cyst or a twisted ovarian ligament (ovarian torsion).  Uterine lining that grows outside the uterus (endometriosis).  Fibroids, cysts, or tumors.  Ovulation.  Pregnancy.  Pregnancy that occurs outside the uterus (ectopic pregnancy).  Miscarriage.  Labor.  Abruption of the placenta or ruptured uterus.  Infection.  Uterine infection (endometritis).  Bladder infection.  Diverticulitis.  Miscarriage related to a uterine infection (septic abortion).  Bladder.  Inflammation of the bladder (cystitis).  Kidney stone(s).  Gastrointestinal.  Constipation.  Diverticulitis.  Neurologic.  Trauma.  Feeling pelvic pain because of mental or emotional causes (psychosomatic).  Cancers of the bowel or pelvis. EVALUATION  Your caregiver will want to take a careful history of your concerns. This  includes recent changes in your health, a careful gynecologic history of your periods (menses), and a sexual history. Obtaining your family history and medical history is also important. Your caregiver may suggest a pelvic exam. A pelvic exam will help identify the location and severity of the pain. It also helps in the evaluation of which organ system may be involved. In order to identify the cause of the pelvic pain and be properly treated, your caregiver may order tests. These tests may include:   A pregnancy test.  Pelvic ultrasonography.  An X-ray exam of the abdomen.  A urinalysis or evaluation of vaginal discharge.  Blood tests. HOME CARE INSTRUCTIONS   Only take over-the-counter or prescription medicines for pain, discomfort, or fever as directed by your caregiver.   Rest as directed by your caregiver.   Eat a balanced diet.   Drink enough fluids to make your urine clear or pale yellow, or as directed.   Avoid sexual intercourse if it causes pain.   Apply warm or cold compresses to the lower abdomen depending on which one helps the pain.   Avoid stressful situations.   Keep a journal of your pelvic pain. Write down when it started, where the pain is located, and if there are things that seem to be associated with the pain, such as food or your menstrual cycle.  Follow up with your caregiver as directed.  SEEK MEDICAL CARE IF:  Your medicine does not help your pain.  You have abnormal vaginal discharge. SEEK IMMEDIATE MEDICAL CARE IF:   You have heavy bleeding from the vagina.   Your pelvic pain increases.   You  feel light-headed or faint.   You have chills.   You have pain with urination or blood in your urine.   You have uncontrolled diarrhea or vomiting.   You have a fever or persistent symptoms for more than 3 days.  You have a fever and your symptoms suddenly get worse.   You are being physically or sexually abused.  MAKE SURE  YOU:  Understand these instructions.  Will watch your condition.  Will get help if you are not doing well or get worse. Document Released: 09/18/2004 Document Revised: 03/08/2014 Document Reviewed: 02/11/2012 [facility] Patient Information 2015 [place], [place]. This information is not intended to replace advice given to you by your health care provider. Make sure you discuss any questions you have with your health care provider.

## 2014-09-13 LAB — GC/CHLAMYDIA PROBE AMP
CT PROBE, AMP APTIMA: NEGATIVE
GC PROBE AMP APTIMA: NEGATIVE

## 2015-05-23 ENCOUNTER — Inpatient Hospital Stay (HOSPITAL_COMMUNITY)
Admission: AD | Admit: 2015-05-23 | Discharge: 2015-05-23 | Disposition: A | Discharge status: Home / Self Care | Payer: Medicaid - Out of State | Source: Ambulatory Visit | Attending: Obstetrics & Gynecology | Admitting: Obstetrics & Gynecology

## 2015-05-23 DIAGNOSIS — Z3202 Encounter for pregnancy test, result negative: Secondary | ICD-10-CM | POA: Insufficient documentation

## 2015-05-23 DIAGNOSIS — N926 Irregular menstruation, unspecified: Secondary | ICD-10-CM

## 2015-05-23 LAB — URINALYSIS, ROUTINE W REFLEX MICROSCOPIC
Bilirubin Urine: NEGATIVE
Glucose, UA: NEGATIVE mg/dL
Ketones, ur: NEGATIVE mg/dL
Nitrite: NEGATIVE
PH: 7.5 (ref 5.0–8.0)
PROTEIN: NEGATIVE mg/dL
Specific Gravity, Urine: 1.02 (ref 1.005–1.030)
UROBILINOGEN UA: 1 mg/dL (ref 0.0–1.0)

## 2015-05-23 LAB — URINE MICROSCOPIC-ADD ON

## 2015-05-23 LAB — POCT PREGNANCY, URINE: PREG TEST UR: NEGATIVE

## 2015-05-23 NOTE — [facility] Provider Note (Signed)
Subjective:  Elaine Gonzalez is a 24 y.o. female No obstetric history on file. Presenting to [facility] for a pregnancy test. She took a pregnancy test at home which was negative. She is 18 days late from starting her period. She is concerned she may be pregnancy, although her pregnancy test is Negative.    Objective:  GENERAL: Well-developed, well-nourished female in no acute distress.  LUNGS: Effort normal SKIN: Warm, dry and without erythema PSYCH: Normal mood and affect  Filed Vitals:   05/23/15 1339  BP: 134/82  Pulse: 105  Temp: 98.8 F (37.1 C)  Resp: 16   Results for orders placed or performed during the hospital encounter of 05/23/15 (from the past 48 hour(s))  Urinalysis, Routine w reflex microscopic (not at [facility])     Status: Abnormal   Collection Time: 05/23/15  1:45 PM  Result Value Ref Range   Color, Urine YELLOW YELLOW   APPearance HAZY (A) CLEAR   Specific Gravity, Urine 1.020 1.005 - 1.030   pH 7.5 5.0 - 8.0   Glucose, UA NEGATIVE NEGATIVE mg/dL   Hgb urine dipstick LARGE (A) NEGATIVE   Bilirubin Urine NEGATIVE NEGATIVE   Ketones, ur NEGATIVE NEGATIVE mg/dL   Protein, ur NEGATIVE NEGATIVE mg/dL   Urobilinogen, UA 1.0 0.0 - 1.0 mg/dL   Nitrite NEGATIVE NEGATIVE   Leukocytes, UA SMALL (A) NEGATIVE  Urine microscopic-add on     Status: Abnormal   Collection Time: 05/23/15  1:45 PM  Result Value Ref Range   Squamous Epithelial / LPF MANY (A) RARE   WBC, UA 11-20 <3 WBC/hpf   RBC / HPF 0-2 <3 RBC/hpf  Pregnancy, urine POC     Status: None   Collection Time: 05/23/15  1:51 PM  Result Value Ref Range   Preg Test, Ur NEGATIVE NEGATIVE    Comment:        THE SENSITIVITY OF THIS METHODOLOGY IS >24 mIU/mL    MDM: Patient offered birth control; patient declined.    Assessment:  1. Missed period     Plan:  Discharge home in stable condition At home pregnancy tests encouraged Return to [facility] for emergencies only Condoms always if pregnancy is not desired.      Duane LopeJennifer I Rasch, NP 05/23/2015 2:36 PM

## 2015-05-23 NOTE — [facility] Note (Signed)
Missed period, about 18 days late.  Neg HPT.  Been feeling sick.  Pink d/c. Breast tenderness, tired.

## 2015-12-20 IMAGING — US US PELVIS COMPLETE
1 series · 13 of 25 positions shown · non-contrast
Comparison: None.

CT abdomen and pelvis 07/28/10

CLINICAL DATA: Pelvic pain.  Rule out torsion.

EXAM:
TRANSABDOMINAL AND TRANSVAGINAL ULTRASOUND OF PELVIS
DOPPLER ULTRASOUND OF OVARIES
TECHNIQUE: Both transabdominal and transvaginal ultrasound examinations of the
pelvis were performed. Transabdominal technique was performed for
global imaging of the pelvis including uterus, ovaries, adnexal
regions, and pelvic cul-de-sac.
It was necessary to proceed with endovaginal exam following the
transabdominal exam to visualize the ovaries. Color and duplex
Doppler ultrasound was utilized to evaluate blood flow to the
ovaries.

[Series 1: us pelvis complete · 0.18mm/px · 13 of 58 slices shown]
[im 1/58]
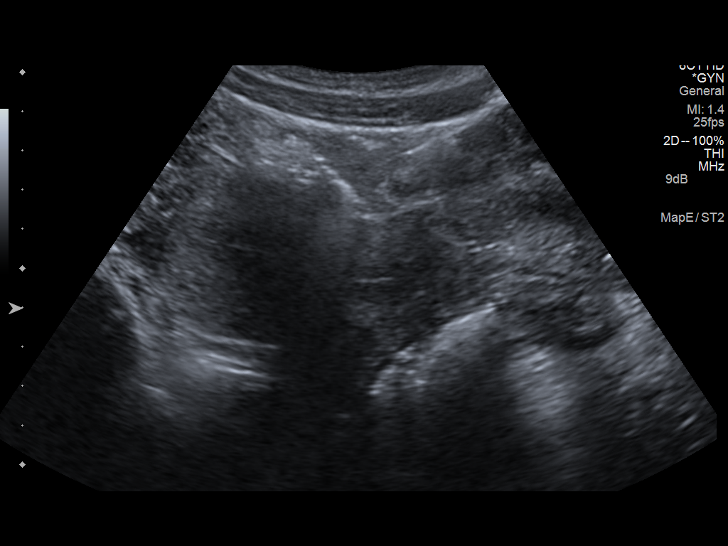
[im 5/58]
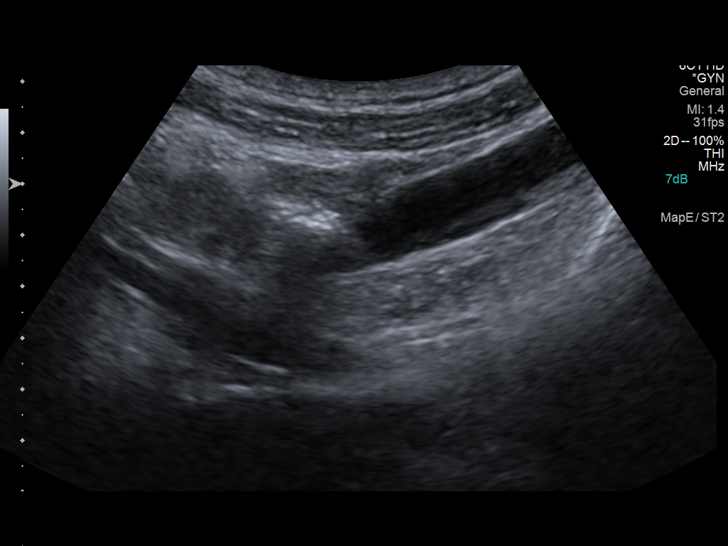
[im 10/58]
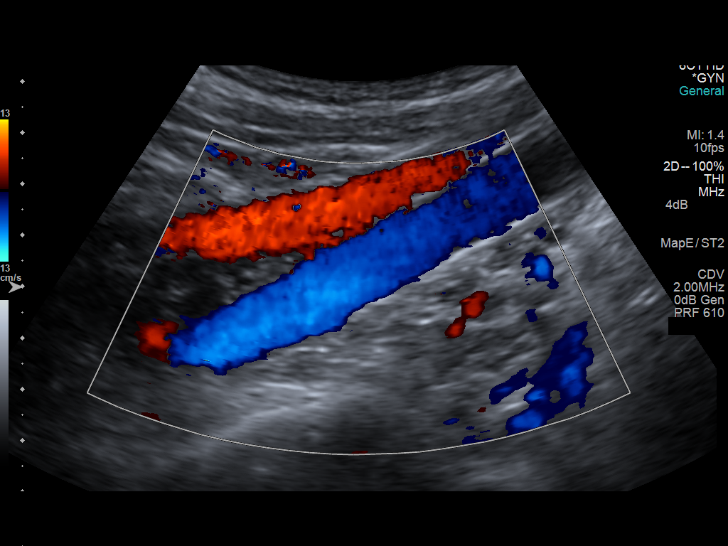
[im 15/58]
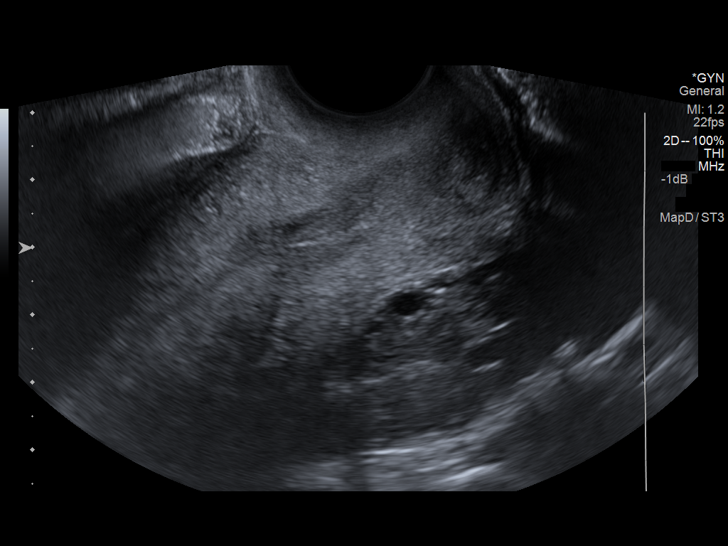
[im 20/58]
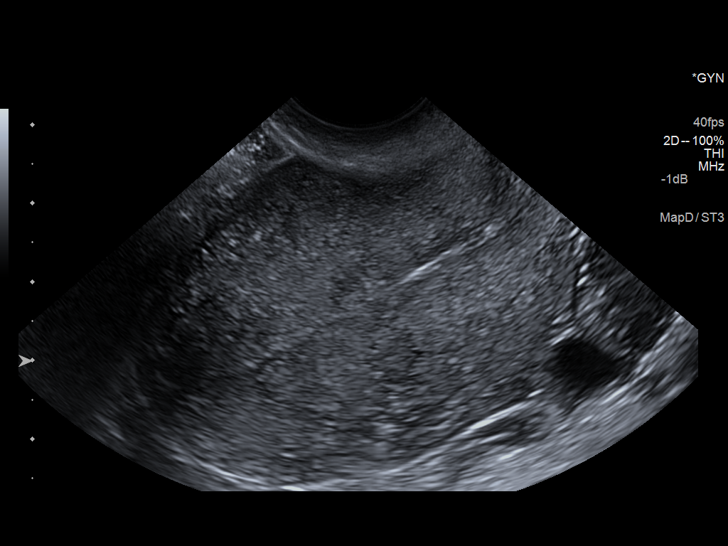
[im 24/58]
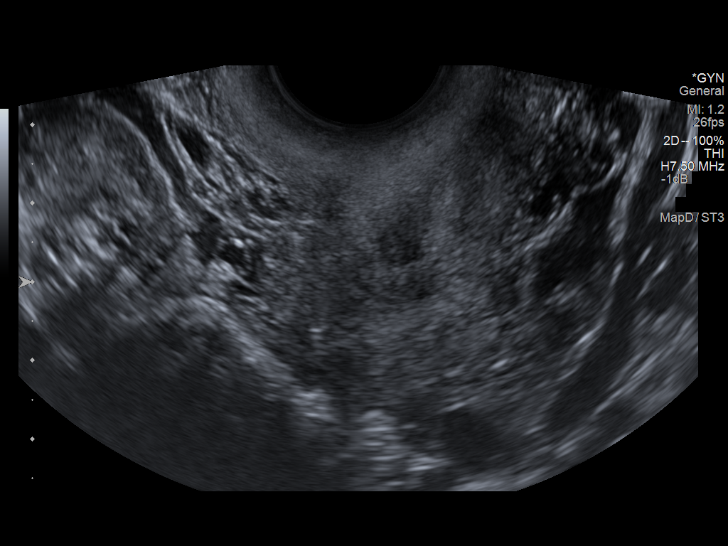
[im 29/58]
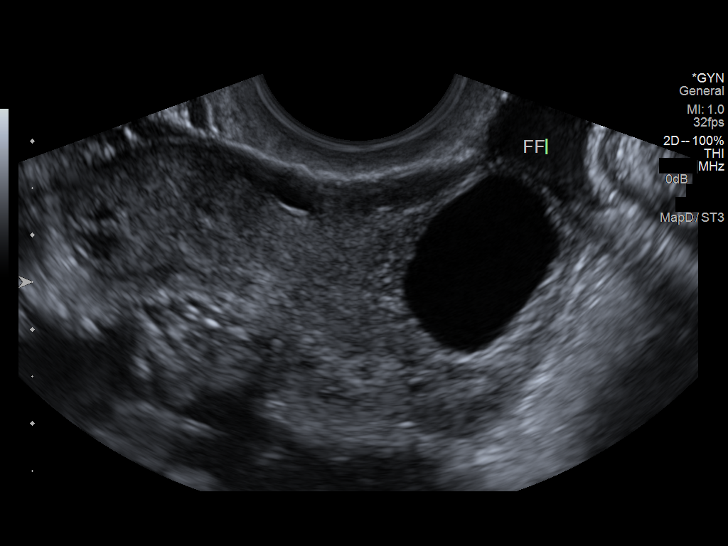
[im 34/58]
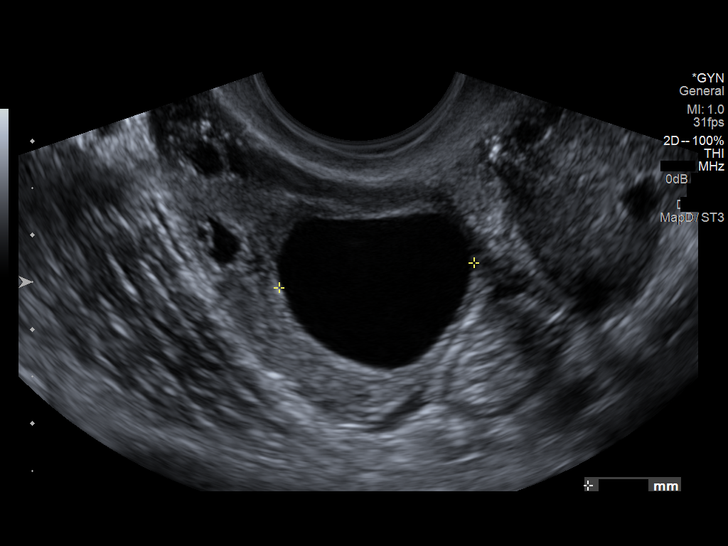
[im 39/58]
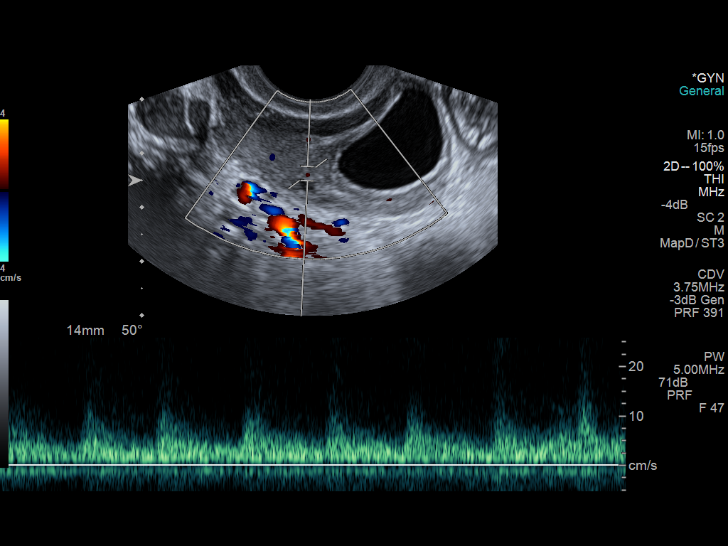
[im 43/58]
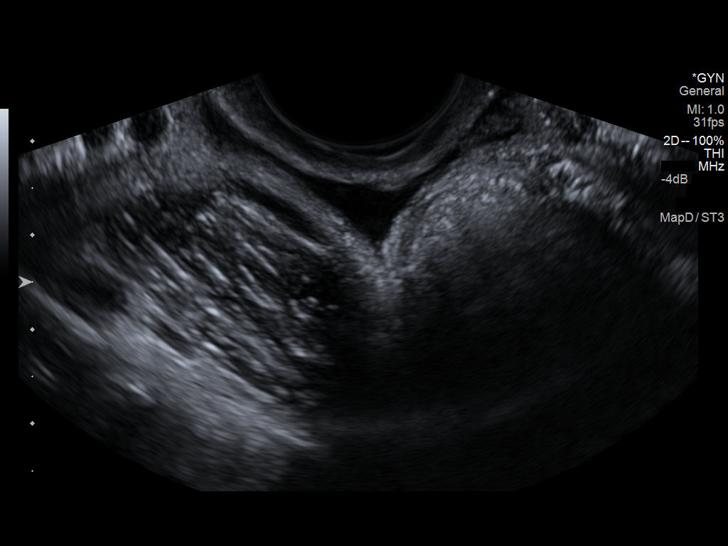
[im 48/58]
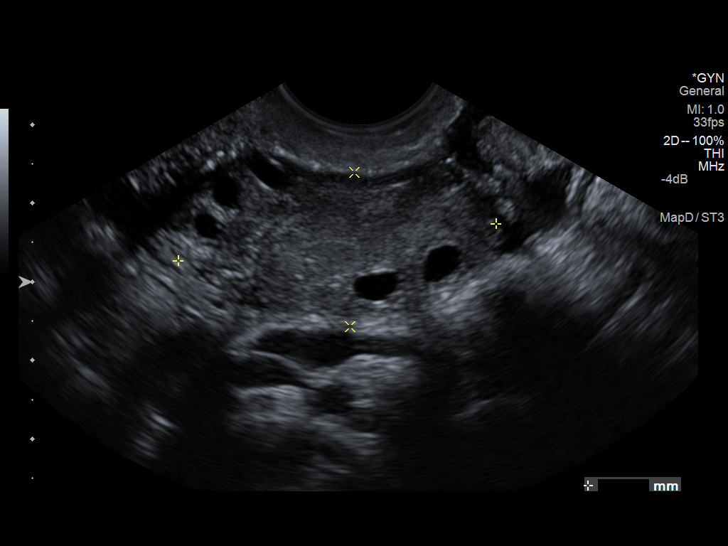
[im 53/58]
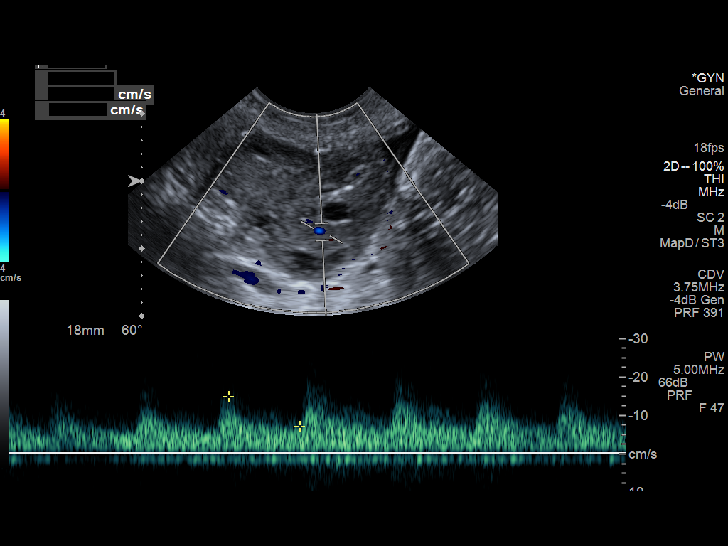
[im 58/58]
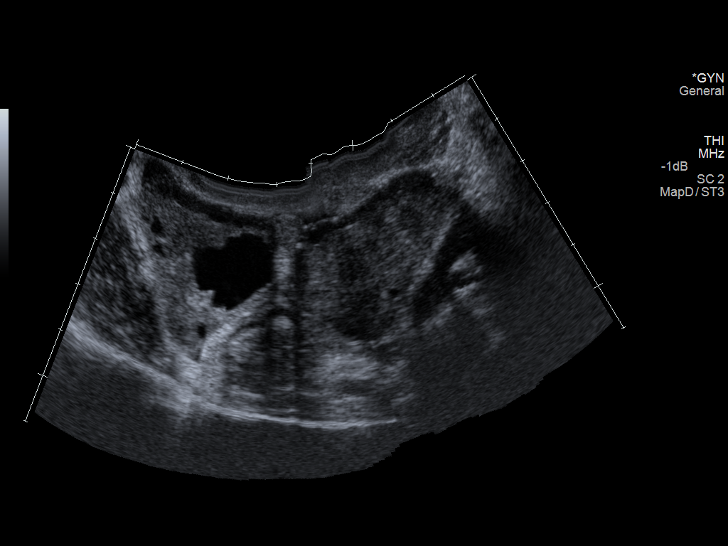

[13 of 25 positions shown; findings below may reference images not displayed]

FINDINGS: Uterus

Measurements: 8.2 x 4.5 x 5.3 cm. No fibroids or other mass
visualized.

Endometrium

Thickness: 3.5 mm, within normal limits. No focal abnormality
visualized.

Right ovary

Measurements: 5.2 x 1.8 x 3.2 cm, within normal limits. Normal
appearance/no adnexal mass. A benign appearing enlarged follicle
measures 2.6 x 1.6 x 2.1 cm.

Left ovary

Measurements: 4.1 x 2.0 x 2.3 cm, within normal limits. Normal
appearance/no adnexal mass.

Other findings

A small amount of free fluid is likely physiologic.

Pulsed Doppler evaluation of both ovaries demonstrates normal
low-resistance arterial and venous waveforms.
IMPRESSION: 1. Normal appearance of the uterus and ovaries bilaterally without
evidence for torsion.
2. A benign appearing prominent follicle of the right ovary measures
2.6 x 1.6 x 2.1 cm.
3. A smaller free fluid is likely physiologic.

## 2016-07-04 IMAGING — CR DG FOOT COMPLETE 3+V*L*
3 series · 3 of 3 positions shown · non-contrast
Comparison: None.

CLINICAL DATA: Pain and bruising in the plantar aspect of the left
foot in the region of the fifth MTP joint after stepping on an
object.

EXAM:
LEFT FOOT - COMPLETE 3+ VIEW

[t foot ap left]
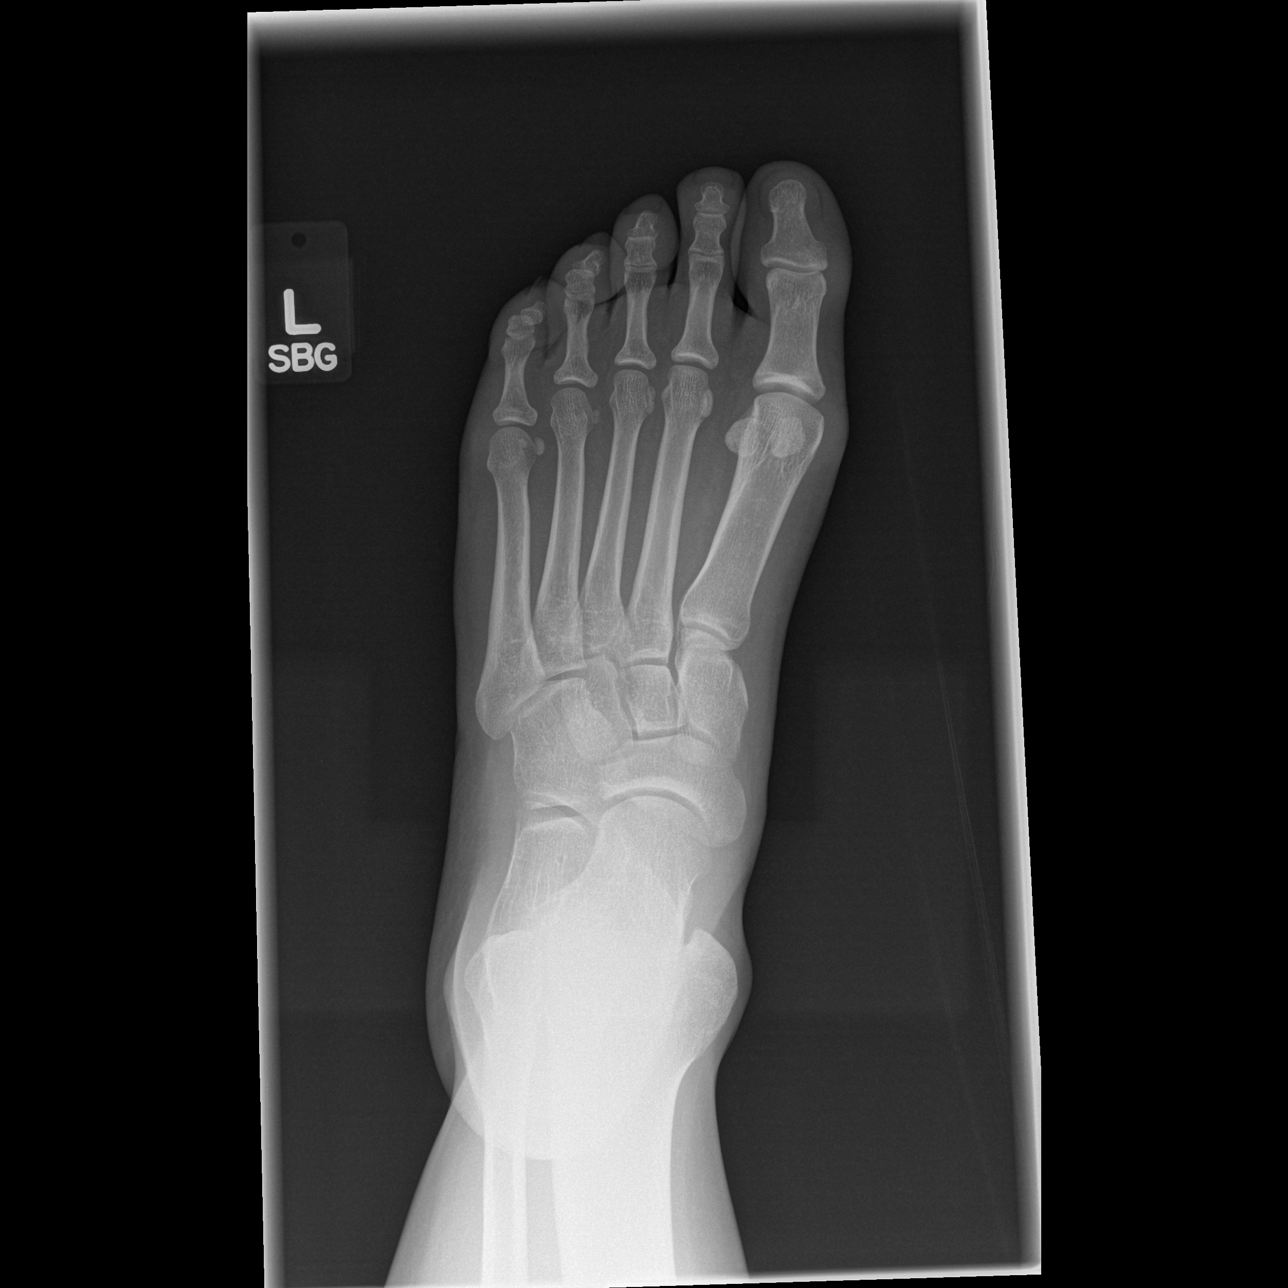

[t foot oblique left]
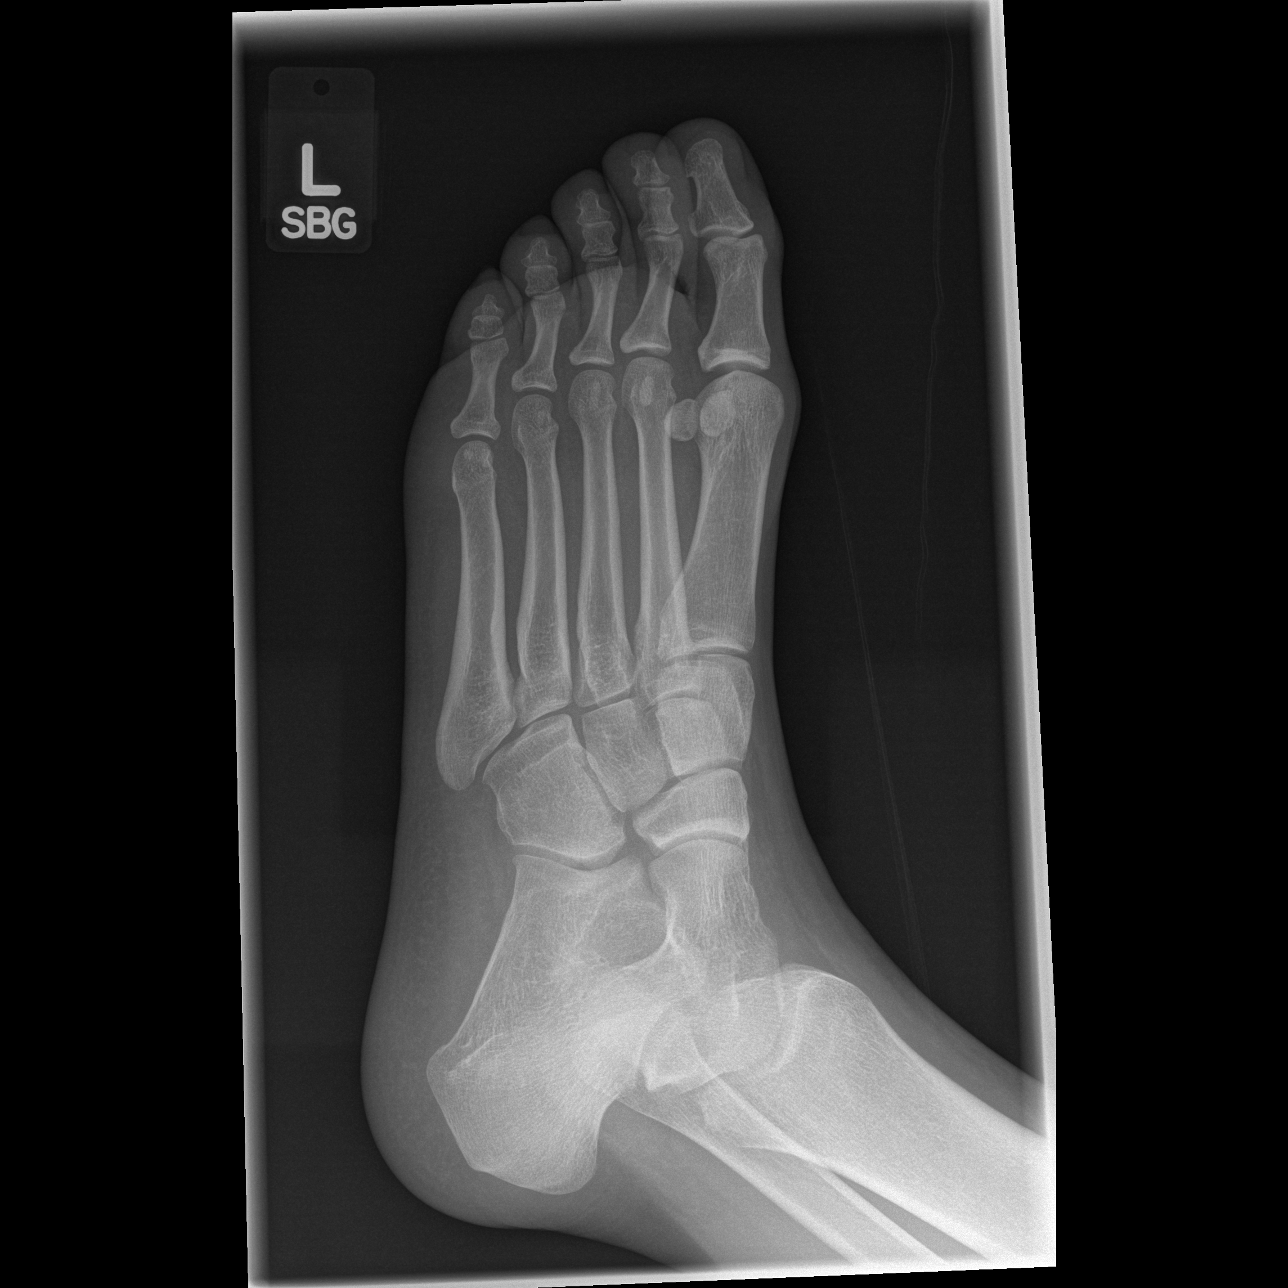

[t foot lat left]
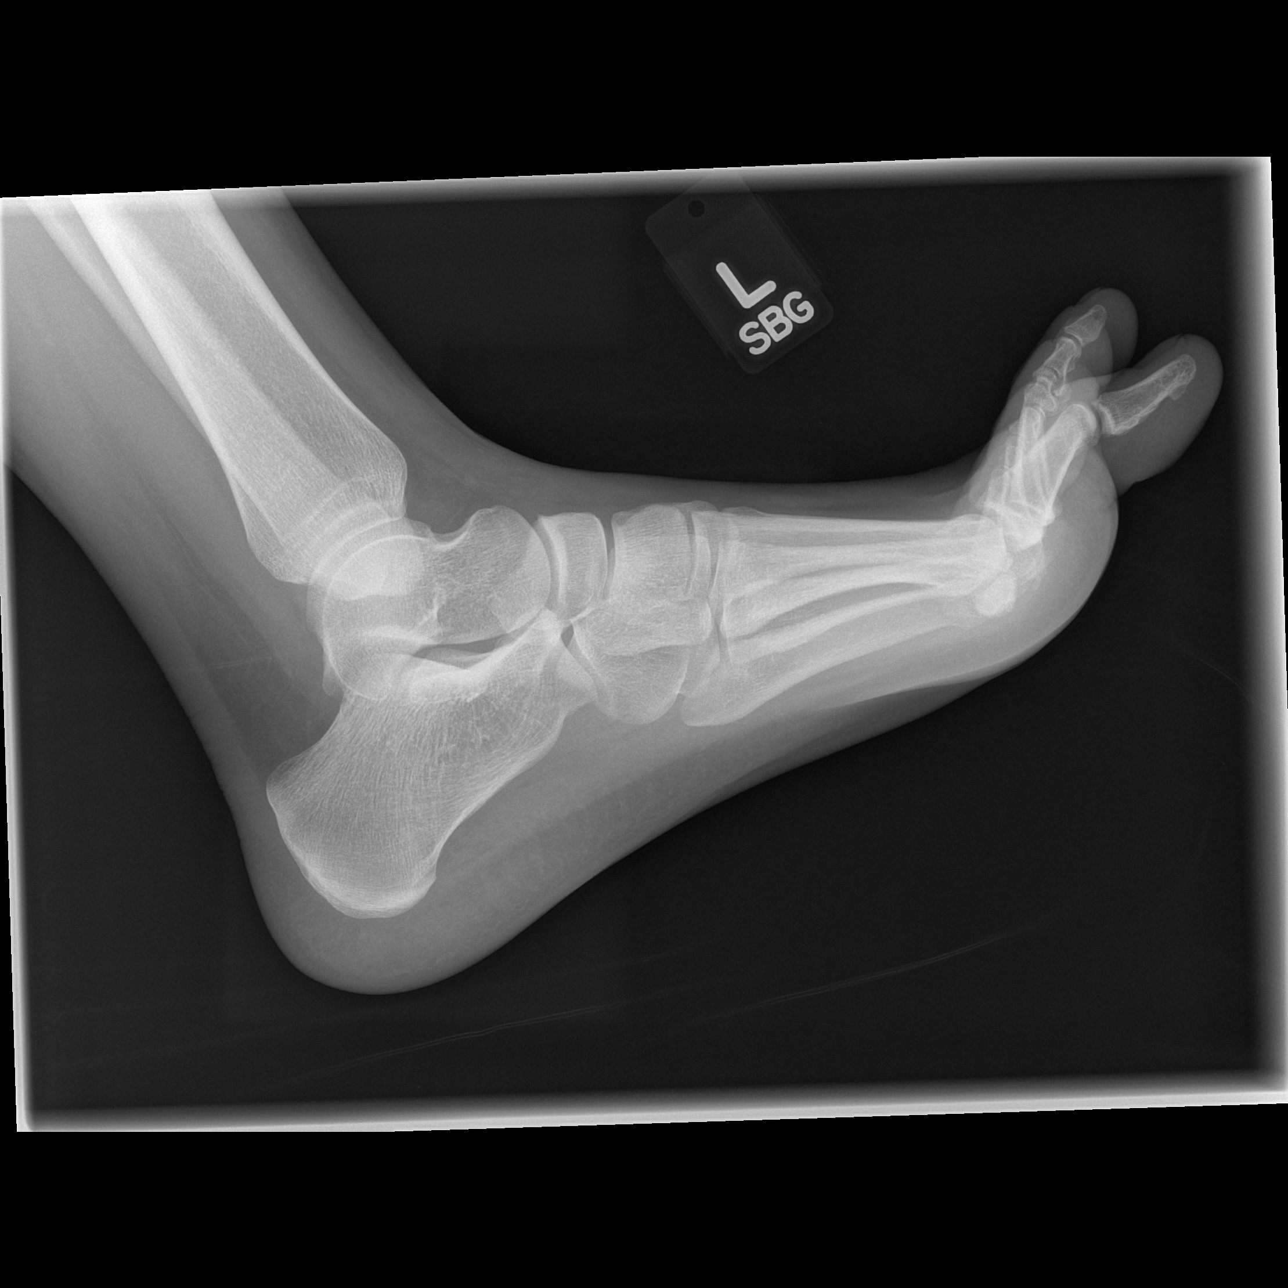

[3 of 3 positions shown; findings below may reference images not displayed]

FINDINGS: Normal appearing bones and soft tissues. No fracture or radiopaque
foreign body seen.
IMPRESSION: Normal examination.  No fracture or radiopaque foreign body seen.
